# Patient Record
Sex: Female | Born: 1980 | Race: White | Hispanic: No | Marital: Married | Smoking: Former smoker
Health system: Southern US, Community
[De-identification: ages and names within clinical notes are randomized; demographics above are authoritative.]

## PROBLEM LIST (undated history)

## (undated) DIAGNOSIS — I10 Essential (primary) hypertension: Secondary | ICD-10-CM

## (undated) DIAGNOSIS — I456 Pre-excitation syndrome: Secondary | ICD-10-CM

## (undated) HISTORY — DX: Pre-excitation syndrome: I45.6

## (undated) HISTORY — PX: BREAST BIOPSY: SHX20

## (undated) HISTORY — DX: Essential (primary) hypertension: I10

## (undated) HISTORY — PX: HERNIA REPAIR: SHX51

---

## 2008-06-08 ENCOUNTER — Encounter: Admission: RE | Admit: 2008-06-08 | Discharge: 2008-06-08 | Payer: Self-pay | Admitting: Obstetrics and Gynecology

## 2010-02-10 ENCOUNTER — Ambulatory Visit (HOSPITAL_COMMUNITY): Admission: RE | Admit: 2010-02-10 | Discharge: 2010-02-10 | Payer: Self-pay | Admitting: Obstetrics and Gynecology

## 2010-06-29 ENCOUNTER — Encounter
Admission: RE | Admit: 2010-06-29 | Discharge: 2010-06-29 | Payer: Self-pay | Source: Home / Self Care | Attending: Obstetrics and Gynecology | Admitting: Obstetrics and Gynecology

## 2010-09-01 ENCOUNTER — Inpatient Hospital Stay (HOSPITAL_COMMUNITY)
Admission: AD | Admit: 2010-09-01 | Discharge: 2010-09-04 | Payer: Self-pay | Source: Home / Self Care | Attending: Obstetrics and Gynecology | Admitting: Obstetrics and Gynecology

## 2010-09-02 ENCOUNTER — Encounter (INDEPENDENT_AMBULATORY_CARE_PROVIDER_SITE_OTHER): Payer: Self-pay | Admitting: Obstetrics and Gynecology

## 2010-09-09 ENCOUNTER — Encounter
Admission: RE | Admit: 2010-09-09 | Discharge: 2010-10-09 | Payer: Self-pay | Source: Home / Self Care | Attending: Obstetrics and Gynecology | Admitting: Obstetrics and Gynecology

## 2010-10-14 NOTE — Discharge Summary (Signed)
Nichole Gibson, Nichole Gibson               ACCOUNT NO.:  [id number]  MEDICAL RECORD NO.:  [id number]          PATIENT TYPE:  INP  LOCATION:  9120                          FACILITY:  [facility]  PHYSICIAN:  Michelle L. Grewal, M.D.DATE OF BIRTH:  1981-01-30  DATE OF ADMISSION:  09/01/2010 DATE OF DISCHARGE:  09/04/2010                              DISCHARGE SUMMARY   ADMITTING DIAGNOSES: 1. Intrauterine pregnancy at term. 2. Spontaneous rupture of membranes. 3. Spontaneous onset of labor.  DISCHARGE DIAGNOSES:  1. Status post low transverse cesarean section secondary to nonreassuring fetal heart tones. 2. Viable female infant  ADMITTING HISTORY AND PHYSICAL:  Please see dictated H and P.  HOSPITAL COURSE:  This is a 30 year old gravida 2, para 0-0-1-0 that was admitted to [facility] at term with spontaneous onset of contractions and spontaneous rupture of membranes.  Ultrasound was performed which revealed a low AFI and BPP of 8/8, the patient was also known to have a right ovarian cyst which was thought to be probable dermoid, decision had been made that the patient did undergo a cesarean delivery that this cyst would be removed at the time of the delivery. The patient was then transferred to the labor and delivery suite where she was reexamined and found to be 4-5 cm, 90% effaced, vertex at -2 station.  Intrauterine pressure catheter was placed for careful observation of her labor.  Penicillin was also administered for group B beta strep prophylaxis.  The patient did receive epidural for her comfort.  Later physician was advised that baby was experiencing some variable backflow, late decelerations.  Pitocin was discontinued.  The patient was already started on oxygen administration.  Physician did discuss with the patient regarding fetal heart tones which were now stable.  Cervix was reexamined and found to be 7-8 cm, 90% effaced, vertex at a -1 station.  With persistent  fetal heart tones that were nonreassuring, decision was made to proceed with a low transverse cesarean section.  The patient was then taken to the operating room where epidural was dosed to an adequate surgical level.  A low transverse incision was made with delivery of a viable female infant weighing 6 pounds 3 ounces with Apgars of 9 at 1 and 9 at 5 minutes. The patient tolerated the procedure well and taken to the recovery room in stable condition.  On postoperative day #1, the patient was without complaint.  Vital signs were stable.  Abdomen soft, with good return of bowel function.  Abdominal dressing noted be clean, dry and intact.  Laboratory findings were within normal limits.  On postoperative day #2, the patient did desire early discharge.  Vital signs were stable.  Abdomen is soft.  Incision was clean, dry, and intact.  Discharge instructions reviewed and the patient was later discharged home.  CONDITION ON DISCHARGE:  Stable.  Diet regular as tolerated.  Activity no heavy lifting, no driving x2 weeks, no vaginal entry.  FOLLOWUP:  Patient to follow up in the office in 1-2 weeks for an incision check.  She is to call for temperature greater than 100 degrees, persistent nausea, vomiting, heavy  vaginal bleeding, and/or redness or drainage from the  incisional site.  DISCHARGE MEDICATIONS:  Tylox #30 one p.o. q.4-6 hours p.r.n., Motrin 600 mg every 6 hours, prenatal vitamins one p.o. daily, and Colace one p.o. daily p.r.n.     Julio Sicks, N.P.   ______________________________ Stann Mainland. Vincente Poli, M.D.    CC/MEDQ  D:  09/27/2010  T:  09/28/2010  Job:  [id number]  Electronically Signed by Julio Sicks N.P. on 09/28/2010 12:00:35 PM Electronically Signed by Marcelle Overlie M.D. on 10/14/2010 08:43:27 AM

## 2010-12-06 LAB — COMPREHENSIVE METABOLIC PANEL
ALT: 15 U/L (ref 0–35)
AST: 25 U/L (ref 0–37)
AST: 30 U/L (ref 0–37)
Albumin: 2.6 g/dL — ABNORMAL LOW (ref 3.5–5.2)
Alkaline Phosphatase: 125 U/L — ABNORMAL HIGH (ref 39–117)
Alkaline Phosphatase: 188 U/L — ABNORMAL HIGH (ref 39–117)
BUN: 11 mg/dL (ref 6–23)
BUN: 6 mg/dL (ref 6–23)
CO2: 19 mEq/L (ref 19–32)
CO2: 26 mEq/L (ref 19–32)
Chloride: 107 mEq/L (ref 96–112)
Creatinine, Ser: 0.46 mg/dL (ref 0.4–1.2)
Creatinine, Ser: 0.63 mg/dL (ref 0.4–1.2)
GFR calc non Af Amer: >60 mL/min (ref >60)
GFR calc non Af Amer: >60 mL/min (ref >60)
Glucose, Bld: 108 mg/dL — ABNORMAL HIGH (ref 70–99)
Potassium: 3.5 mEq/L (ref 3.5–5.1)
Total Bilirubin: 0.1 mg/dL — ABNORMAL LOW (ref 0.3–1.2)
Total Bilirubin: 0.3 mg/dL (ref 0.3–1.2)
Total Protein: 5.9 g/dL — ABNORMAL LOW (ref 6.0–8.3)

## 2010-12-06 LAB — URINALYSIS, ROUTINE W REFLEX MICROSCOPIC
Bilirubin Urine: NEGATIVE
Glucose, UA: NEGATIVE mg/dL
Ketones, ur: NEGATIVE mg/dL
Nitrite: NEGATIVE
Protein, ur: NEGATIVE mg/dL
Specific Gravity, Urine: <1.005 — ABNORMAL LOW (ref 1.005–1.030)
Urobilinogen, UA: 0.2 mg/dL (ref 0.0–1.0)
pH: 6 (ref 5.0–8.0)

## 2010-12-06 LAB — CBC
HCT: 34.2 % — ABNORMAL LOW (ref 36.0–46.0)
Hemoglobin: 14.1 g/dL (ref 12.0–15.0)
Hemoglobin: 14.1 g/dL (ref 12.0–15.0)
MCH: 31.4 pg (ref 26.0–34.0)
MCHC: 34.3 g/dL (ref 30.0–36.0)
MCHC: 34.5 g/dL (ref 30.0–36.0)
MCV: 88.9 fL (ref 78.0–100.0)
Platelets: 198 10*3/uL (ref 150–400)
RDW: 14 % (ref 11.5–15.5)

## 2010-12-06 LAB — RPR: RPR Ser Ql: NONREACTIVE

## 2010-12-06 LAB — URINE MICROSCOPIC-ADD ON

## 2010-12-06 LAB — LACTATE DEHYDROGENASE: LDH: 163 U/L (ref 94–250)

## 2012-01-11 LAB — OB RESULTS CONSOLE HIV ANTIBODY (ROUTINE TESTING): HIV: NONREACTIVE

## 2012-01-11 LAB — OB RESULTS CONSOLE RPR: RPR: NONREACTIVE

## 2012-01-11 LAB — OB RESULTS CONSOLE RUBELLA ANTIBODY, IGM: Rubella: IMMUNE

## 2012-01-11 LAB — OB RESULTS CONSOLE ABO/RH: RH Type: POSITIVE

## 2012-06-13 ENCOUNTER — Encounter: Payer: Self-pay | Admitting: *Deleted

## 2012-06-13 ENCOUNTER — Encounter: Payer: Managed Care, Other (non HMO) | Attending: Obstetrics and Gynecology | Admitting: *Deleted

## 2012-06-13 DIAGNOSIS — O99810 Abnormal glucose complicating pregnancy: Secondary | ICD-10-CM | POA: Insufficient documentation

## 2012-06-13 DIAGNOSIS — Z713 Dietary counseling and surveillance: Secondary | ICD-10-CM | POA: Insufficient documentation

## 2012-06-13 NOTE — Progress Notes (Signed)
  Patient was seen 1-on-1 on 06/13/2012 for [facility]. The following learning objectives were met by the patient during this visit: Patient attended Class with last pregnancy and expressed good understanding of the following information upon review:   States the definition of Gestational Diabetes  States why dietary management is important in controlling blood glucose  Describes the effects each nutrient has on blood glucose levels  Demonstrates ability to create a balanced meal plan  Demonstrates carbohydrate counting   States when to check blood glucose levels  Demonstrates proper blood glucose monitoring techniques  States the effect of stress and exercise on blood glucose levels  States the importance of limiting caffeine and abstaining from alcohol and smoking  Blood glucose monitor: Information provided on ReiOn Meter available through [facility] as she states her co-pay for strips is too high. Therefore no meter provided at this visit  Patient instructed to monitor glucose levels: FBS: 60 - <90 2 hour: <120  *Patient received handouts:  Nutrition Diabetes and Pregnancy  Carbohydrate Counting List  Patient will be seen for follow-up as needed.

## 2012-06-13 NOTE — Patient Instructions (Signed)
Goals:  Check glucose levels per MD as instructed  Follow Gestational Diabetes Diet as instructed  Call for follow-up as needed    

## 2012-07-29 ENCOUNTER — Encounter (HOSPITAL_COMMUNITY): Payer: Self-pay | Admitting: Pharmacist

## 2012-07-29 NOTE — H&P (Signed)
Nichole Gibson  DICTATION # [id number] CSN# [id number]   Meriel Pica, MD 07/29/2012 10:05 AM

## 2012-07-30 NOTE — H&P (Signed)
Nichole Gibson, Nichole Gibson               ACCOUNT NO.:  [id number]  MEDICAL RECORD NO.:  [id number]  LOCATION:                                 FACILITY:  PHYSICIAN:  Duke Salvia. Marcelle Overlie, M.D.DATE OF BIRTH:  [birth year]  DATE OF ADMISSION:  08/09/2012 DATE OF DISCHARGE:                             HISTORY & PHYSICAL   CHIEF COMPLAINT:  For repeat cesarean section at term.  HISTORY OF PRESENT ILLNESS:  A 31 year old, G3, P1-0-1-1, EDD November 22 presents at term for repeat cesarean section.  This pregnancy was complicated by gestational diabetes with excellent CBG control by diet alone.  The procedure of repeat cesarean section including specific risks related to bleeding, infection, transfusion, adjacent organ injury, wound infection, phlebitis discussed with her which she understands and accepts.  PAST MEDICAL HISTORY:  First C-section in 2011 for arrest of dilatation. Please see her [facility] form for the remainder review of systems and past medical history.  PHYSICAL EXAMINATION:  VITAL SIGNS:  Temp 98.2, blood pressure 130/78. HEENT:  Unremarkable. NECK:  Supple without masses. LUNGS:  Clear. CARDIOVASCULAR:  Regular rate and rhythm without murmurs, rubs, gallops. BREASTS:  Without masses. PELVIC:  Term fundal height.  Fetal heart rate 140.  Cervix was closed. EXTREMITIES AND NEUROLOGIC:  Unremarkable.  IMPRESSION:  Term pregnancy, previous cesarean section, declines p.o. well.  PLAN:  Repeat cesarean section.  Procedure and risks discussed as above.     Khyle Goodell M. Marcelle Overlie, M.D.     RMH/MEDQ  D:  07/29/2012  T:  07/30/2012  Job:  [id number]

## 2012-08-06 ENCOUNTER — Encounter (HOSPITAL_COMMUNITY): Payer: Self-pay

## 2012-08-07 ENCOUNTER — Encounter (HOSPITAL_COMMUNITY)
Admission: RE | Admit: 2012-08-07 | Discharge: 2012-08-07 | Disposition: A | Discharge status: Home / Self Care | Payer: Managed Care, Other (non HMO) | Source: Ambulatory Visit | Attending: Obstetrics and Gynecology | Admitting: Obstetrics and Gynecology

## 2012-08-07 ENCOUNTER — Encounter (HOSPITAL_COMMUNITY): Payer: Self-pay

## 2012-08-07 LAB — BASIC METABOLIC PANEL
GFR calc Af Amer: >90 mL/min (ref >90)
GFR calc non Af Amer: >90 mL/min (ref >90)
Potassium: 3.8 mEq/L (ref 3.5–5.1)
Sodium: 135 mEq/L (ref 135–145)

## 2012-08-07 LAB — CBC
Hemoglobin: 13.7 g/dL (ref 12.0–15.0)
MCH: 30.4 pg (ref 26.0–34.0)
MCHC: 34.6 g/dL (ref 30.0–36.0)

## 2012-08-07 NOTE — Patient Instructions (Signed)
Your procedure is scheduled on:08/09/12  Enter through the Main Entrance at :6am Pick up desk phone and dial [phone] and inform us of your arrival.  Please call [phone] if you have any problems the morning of surgery.  Remember: Do not eat or drink after midnight:Thursday   Take these meds the morning of surgery with a sip of water:none  DO NOT wear jewelry, eye make-up, lipstick,body lotion, or dark fingernail polish. Do not shave for 48 hours prior to surgery.  If you are to be admitted after surgery, leave suitcase in car until your room has been assigned.

## 2012-08-08 LAB — TYPE AND SCREEN: Unit division: 0

## 2012-08-08 LAB — RPR: RPR Ser Ql: NONREACTIVE

## 2012-08-09 ENCOUNTER — Inpatient Hospital Stay (HOSPITAL_COMMUNITY)
Admission: RE | Admit: 2012-08-09 | Discharge: 2012-08-11 | DRG: 766 | Disposition: A | Discharge status: Home / Self Care | Payer: Managed Care, Other (non HMO) | Source: Ambulatory Visit | Attending: Obstetrics and Gynecology | Admitting: Obstetrics and Gynecology

## 2012-08-09 ENCOUNTER — Encounter (HOSPITAL_COMMUNITY): Payer: Self-pay

## 2012-08-09 ENCOUNTER — Encounter (HOSPITAL_COMMUNITY)
Admission: RE | Disposition: A | Discharge status: Home / Self Care | Payer: Self-pay | Source: Ambulatory Visit | Attending: Obstetrics and Gynecology

## 2012-08-09 ENCOUNTER — Inpatient Hospital Stay (HOSPITAL_COMMUNITY): Payer: Managed Care, Other (non HMO)

## 2012-08-09 ENCOUNTER — Encounter (HOSPITAL_COMMUNITY): Payer: Self-pay | Admitting: *Deleted

## 2012-08-09 DIAGNOSIS — O34219 Maternal care for unspecified type scar from previous cesarean delivery: Principal | ICD-10-CM | POA: Diagnosis present

## 2012-08-09 DIAGNOSIS — O99814 Abnormal glucose complicating childbirth: Secondary | ICD-10-CM | POA: Diagnosis present

## 2012-08-09 DIAGNOSIS — Z01812 Encounter for preprocedural laboratory examination: Secondary | ICD-10-CM

## 2012-08-09 DIAGNOSIS — Z01818 Encounter for other preprocedural examination: Secondary | ICD-10-CM

## 2012-08-09 LAB — GLUCOSE, CAPILLARY
Glucose-Capillary: 84 mg/dL (ref 70–99)
Glucose-Capillary: 88 mg/dL (ref 70–99)

## 2012-08-09 SURGERY — Surgical Case
Anesthesia: Spinal

## 2012-08-09 MED ORDER — SCOPOLAMINE 1 MG/3DAYS TD PT72
MEDICATED_PATCH | TRANSDERMAL | Status: AC
  Administered 2012-08-09: 1.5 mg via TRANSDERMAL
  Filled 2012-08-09: qty 1

## 2012-08-09 MED ORDER — WITCH HAZEL-GLYCERIN EX PADS: 1.0000 "application " | MEDICATED_PAD | CUTANEOUS | Status: DC | PRN

## 2012-08-09 MED ORDER — METOCLOPRAMIDE HCL 5 MG/ML IJ SOLN: 10.0000 mg | Freq: Three times a day (TID) | INTRAMUSCULAR | Status: DC | PRN

## 2012-08-09 MED ORDER — NALBUPHINE HCL 10 MG/ML IJ SOLN
5.0000 mg | INTRAMUSCULAR | Status: DC | PRN
  Filled 2012-08-09: qty 1

## 2012-08-09 MED ORDER — PHENYLEPHRINE 40 MCG/ML (10ML) SYRINGE FOR IV PUSH (FOR BLOOD PRESSURE SUPPORT)
PREFILLED_SYRINGE | INTRAVENOUS | Status: AC
  Filled 2012-08-09: qty 5

## 2012-08-09 MED ORDER — EPHEDRINE SULFATE 50 MG/ML IJ SOLN
INTRAMUSCULAR | Status: DC | PRN
  Administered 2012-08-09: 5 mg via INTRAVENOUS
  Administered 2012-08-09 (×2): 10 mg via INTRAVENOUS

## 2012-08-09 MED ORDER — DIPHENHYDRAMINE HCL 25 MG PO CAPS: 25.0000 mg | ORAL_CAPSULE | Freq: Four times a day (QID) | ORAL | Status: DC | PRN

## 2012-08-09 MED ORDER — DIPHENHYDRAMINE HCL 25 MG PO CAPS: 25.0000 mg | ORAL_CAPSULE | ORAL | Status: DC | PRN

## 2012-08-09 MED ORDER — SIMETHICONE 80 MG PO CHEW
80.0000 mg | CHEWABLE_TABLET | Freq: Three times a day (TID) | ORAL | Status: DC
  Administered 2012-08-09 – 2012-08-11 (×7): 80 mg via ORAL

## 2012-08-09 MED ORDER — SODIUM CHLORIDE 0.9 % IJ SOLN: 3.0000 mL | INTRAMUSCULAR | Status: DC | PRN

## 2012-08-09 MED ORDER — MEASLES, MUMPS & RUBELLA VAC ~~LOC~~ INJ
0.5000 mL | INJECTION | Freq: Once | SUBCUTANEOUS | Status: DC
  Filled 2012-08-09: qty 0.5

## 2012-08-09 MED ORDER — SCOPOLAMINE 1 MG/3DAYS TD PT72
1.0000 | MEDICATED_PATCH | Freq: Once | TRANSDERMAL | Status: DC
  Administered 2012-08-09: 1.5 mg via TRANSDERMAL

## 2012-08-09 MED ORDER — MORPHINE SULFATE (PF) 0.5 MG/ML IJ SOLN
INTRAMUSCULAR | Status: DC | PRN
  Administered 2012-08-09: .1 mg via INTRATHECAL

## 2012-08-09 MED ORDER — LACTATED RINGERS IV SOLN
INTRAVENOUS | Status: DC
  Administered 2012-08-09: 125 mL/h via INTRAVENOUS
  Administered 2012-08-09: 08:00:00 via INTRAVENOUS
  Administered 2012-08-09: 125 mL/h via INTRAVENOUS

## 2012-08-09 MED ORDER — SENNOSIDES-DOCUSATE SODIUM 8.6-50 MG PO TABS
2.0000 | ORAL_TABLET | Freq: Every day | ORAL | Status: DC
  Administered 2012-08-09 – 2012-08-10 (×2): 2 via ORAL

## 2012-08-09 MED ORDER — BISACODYL 10 MG RE SUPP: 10.0000 mg | Freq: Every day | RECTAL | Status: DC | PRN

## 2012-08-09 MED ORDER — IBUPROFEN 800 MG PO TABS
800.0000 mg | ORAL_TABLET | Freq: Three times a day (TID) | ORAL | Status: DC | PRN
  Administered 2012-08-10: 800 mg via ORAL
  Filled 2012-08-09: qty 1

## 2012-08-09 MED ORDER — CEFAZOLIN SODIUM-DEXTROSE 2-3 GM-% IV SOLR
INTRAVENOUS | Status: AC
  Filled 2012-08-09: qty 50

## 2012-08-09 MED ORDER — NALOXONE HCL 0.4 MG/ML IJ SOLN: 0.4000 mg | INTRAMUSCULAR | Status: DC | PRN

## 2012-08-09 MED ORDER — OXYTOCIN 10 UNIT/ML IJ SOLN
40.0000 [IU] | INTRAVENOUS | Status: DC | PRN
  Administered 2012-08-09: 40 [IU] via INTRAVENOUS

## 2012-08-09 MED ORDER — KETOROLAC TROMETHAMINE 60 MG/2ML IM SOLN
60.0000 mg | Freq: Once | INTRAMUSCULAR | Status: AC | PRN
  Administered 2012-08-09: 60 mg via INTRAMUSCULAR

## 2012-08-09 MED ORDER — KETOROLAC TROMETHAMINE 60 MG/2ML IM SOLN
INTRAMUSCULAR | Status: AC
  Administered 2012-08-09: 60 mg via INTRAMUSCULAR
  Filled 2012-08-09: qty 2

## 2012-08-09 MED ORDER — FENTANYL CITRATE 0.05 MG/ML IJ SOLN
INTRAMUSCULAR | Status: DC | PRN
  Administered 2012-08-09: 25 ug via INTRATHECAL

## 2012-08-09 MED ORDER — SIMETHICONE 80 MG PO CHEW: 80.0000 mg | CHEWABLE_TABLET | ORAL | Status: DC | PRN

## 2012-08-09 MED ORDER — DIPHENHYDRAMINE HCL 50 MG/ML IJ SOLN: 25.0000 mg | INTRAMUSCULAR | Status: DC | PRN

## 2012-08-09 MED ORDER — KETOROLAC TROMETHAMINE 30 MG/ML IJ SOLN: 30.0000 mg | Freq: Four times a day (QID) | INTRAMUSCULAR | Status: AC | PRN

## 2012-08-09 MED ORDER — SCOPOLAMINE 1 MG/3DAYS TD PT72
1.0000 | MEDICATED_PATCH | Freq: Once | TRANSDERMAL | Status: DC
  Filled 2012-08-09: qty 1

## 2012-08-09 MED ORDER — ONDANSETRON HCL 4 MG/2ML IJ SOLN: 4.0000 mg | Freq: Three times a day (TID) | INTRAMUSCULAR | Status: DC | PRN

## 2012-08-09 MED ORDER — ONDANSETRON HCL 4 MG/2ML IJ SOLN
INTRAMUSCULAR | Status: AC
  Filled 2012-08-09: qty 2

## 2012-08-09 MED ORDER — FENTANYL CITRATE 0.05 MG/ML IJ SOLN
INTRAMUSCULAR | Status: AC
  Filled 2012-08-09: qty 2

## 2012-08-09 MED ORDER — DIPHENHYDRAMINE HCL 50 MG/ML IJ SOLN: 12.5000 mg | INTRAMUSCULAR | Status: DC | PRN

## 2012-08-09 MED ORDER — ONDANSETRON HCL 4 MG PO TABS: 4.0000 mg | ORAL_TABLET | ORAL | Status: DC | PRN

## 2012-08-09 MED ORDER — DIBUCAINE 1 % RE OINT: 1.0000 "application " | TOPICAL_OINTMENT | RECTAL | Status: DC | PRN

## 2012-08-09 MED ORDER — PHENYLEPHRINE HCL 10 MG/ML IJ SOLN
INTRAMUSCULAR | Status: DC | PRN
  Administered 2012-08-09 (×2): 40 ug via INTRAVENOUS

## 2012-08-09 MED ORDER — LANOLIN HYDROUS EX OINT: 1.0000 "application " | TOPICAL_OINTMENT | CUTANEOUS | Status: DC | PRN

## 2012-08-09 MED ORDER — CEFAZOLIN SODIUM-DEXTROSE 2-3 GM-% IV SOLR
2.0000 g | INTRAVENOUS | Status: AC
  Administered 2012-08-09: 2 g via INTRAVENOUS

## 2012-08-09 MED ORDER — HYDROMORPHONE HCL PF 1 MG/ML IJ SOLN: 0.2500 mg | INTRAMUSCULAR | Status: DC | PRN

## 2012-08-09 MED ORDER — SODIUM CHLORIDE 0.9 % IV SOLN: 250.0000 mL | INTRAVENOUS | Status: DC

## 2012-08-09 MED ORDER — ZOLPIDEM TARTRATE 5 MG PO TABS: 5.0000 mg | ORAL_TABLET | Freq: Every evening | ORAL | Status: DC | PRN

## 2012-08-09 MED ORDER — MEPERIDINE HCL 25 MG/ML IJ SOLN: 6.2500 mg | INTRAMUSCULAR | Status: DC | PRN

## 2012-08-09 MED ORDER — BUPIVACAINE IN DEXTROSE 0.75-8.25 % IT SOLN
INTRATHECAL | Status: DC | PRN
  Administered 2012-08-09: 1.4 mL via INTRATHECAL

## 2012-08-09 MED ORDER — SODIUM CHLORIDE 0.9 % IJ SOLN: 3.0000 mL | Freq: Two times a day (BID) | INTRAMUSCULAR | Status: DC

## 2012-08-09 MED ORDER — FLEET ENEMA 7-19 GM/118ML RE ENEM: 1.0000 | ENEMA | Freq: Every day | RECTAL | Status: DC | PRN

## 2012-08-09 MED ORDER — OXYTOCIN 40 UNITS IN LACTATED RINGERS INFUSION - SIMPLE MED: 62.5000 mL/h | INTRAVENOUS | Status: AC

## 2012-08-09 MED ORDER — ONDANSETRON HCL 4 MG/2ML IJ SOLN
INTRAMUSCULAR | Status: DC | PRN
  Administered 2012-08-09: 4 mg via INTRAVENOUS

## 2012-08-09 MED ORDER — ONDANSETRON HCL 4 MG/2ML IJ SOLN: 4.0000 mg | INTRAMUSCULAR | Status: DC | PRN

## 2012-08-09 MED ORDER — EPHEDRINE 5 MG/ML INJ
INTRAVENOUS | Status: AC
  Filled 2012-08-09: qty 10

## 2012-08-09 MED ORDER — MORPHINE SULFATE 0.5 MG/ML IJ SOLN
INTRAMUSCULAR | Status: AC
  Filled 2012-08-09: qty 10

## 2012-08-09 MED ORDER — MENTHOL 3 MG MT LOZG: 1.0000 | LOZENGE | OROMUCOSAL | Status: DC | PRN

## 2012-08-09 MED ORDER — PRENATAL MULTIVITAMIN CH
1.0000 | ORAL_TABLET | Freq: Every day | ORAL | Status: DC
  Administered 2012-08-09 – 2012-08-11 (×3): 1 via ORAL
  Filled 2012-08-09 (×3): qty 1

## 2012-08-09 MED ORDER — OXYCODONE-ACETAMINOPHEN 5-325 MG PO TABS
1.0000 | ORAL_TABLET | Freq: Four times a day (QID) | ORAL | Status: DC | PRN
  Administered 2012-08-10 – 2012-08-11 (×3): 1 via ORAL
  Filled 2012-08-09 (×4): qty 1

## 2012-08-09 MED ORDER — TETANUS-DIPHTH-ACELL PERTUSSIS 5-2.5-18.5 LF-MCG/0.5 IM SUSP: 0.5000 mL | Freq: Once | INTRAMUSCULAR | Status: DC

## 2012-08-09 MED ORDER — OXYTOCIN 10 UNIT/ML IJ SOLN
INTRAMUSCULAR | Status: AC
  Filled 2012-08-09: qty 4

## 2012-08-09 MED ORDER — NALOXONE HCL 1 MG/ML IJ SOLN
1.0000 ug/kg/h | INTRAVENOUS | Status: DC | PRN
  Filled 2012-08-09: qty 2

## 2012-08-09 MED ORDER — KETOROLAC TROMETHAMINE 30 MG/ML IJ SOLN
30.0000 mg | Freq: Four times a day (QID) | INTRAMUSCULAR | Status: AC | PRN
  Administered 2012-08-09: 30 mg via INTRAVENOUS
  Filled 2012-08-09: qty 1

## 2012-08-09 MED ORDER — IBUPROFEN 600 MG PO TABS
600.0000 mg | ORAL_TABLET | Freq: Four times a day (QID) | ORAL | Status: DC | PRN
  Administered 2012-08-10 – 2012-08-11 (×4): 600 mg via ORAL
  Filled 2012-08-09 (×4): qty 1

## 2012-08-09 SURGICAL SUPPLY — 27 items
CLOTH BEACON ORANGE TIMEOUT ST (SAFETY) ×2 IMPLANT
DRAPE SURG 17X23 STRL (DRAPES) ×2 IMPLANT
DRESSING TELFA 8X3 (GAUZE/BANDAGES/DRESSINGS) IMPLANT
DRSG COVADERM 4X10 (GAUZE/BANDAGES/DRESSINGS) ×2 IMPLANT
DURAPREP 26ML APPLICATOR (WOUND CARE) ×2 IMPLANT
ELECT REM PT RETURN 9FT ADLT (ELECTROSURGICAL) ×2
ELECTRODE REM PT RTRN 9FT ADLT (ELECTROSURGICAL) ×1 IMPLANT
EXTRACTOR VACUUM M CUP 4 TUBE (SUCTIONS) IMPLANT
GAUZE SPONGE 4X4 12PLY STRL LF (GAUZE/BANDAGES/DRESSINGS) ×4 IMPLANT
GLOVE BIO SURGEON STRL SZ7 (GLOVE) ×4 IMPLANT
GOWN PREVENTION PLUS LG XLONG (DISPOSABLE) ×6 IMPLANT
KIT ABG SYR 3ML LUER SLIP (SYRINGE) IMPLANT
NEEDLE HYPO 25X5/8 SAFETYGLIDE (NEEDLE) ×2 IMPLANT
NS IRRIG 1000ML POUR BTL (IV SOLUTION) ×2 IMPLANT
PACK C SECTION WH (CUSTOM PROCEDURE TRAY) ×2 IMPLANT
PAD ABD 7.5X8 STRL (GAUZE/BANDAGES/DRESSINGS) IMPLANT
PAD OB MATERNITY 4.3X12.25 (PERSONAL CARE ITEMS) IMPLANT
SLEEVE SCD COMPRESS KNEE MED (MISCELLANEOUS) IMPLANT
STRIP CLOSURE SKIN 1/2X4 (GAUZE/BANDAGES/DRESSINGS) ×2 IMPLANT
SUT CHROMIC 0 CTX 36 (SUTURE) ×6 IMPLANT
SUT MON AB 4-0 PS1 27 (SUTURE) ×2 IMPLANT
SUT PDS AB 0 CT1 27 (SUTURE) ×4 IMPLANT
SUT VIC AB 3-0 CT1 27 (SUTURE) ×2
SUT VIC AB 3-0 CT1 TAPERPNT 27 (SUTURE) ×2 IMPLANT
TOWEL OR 17X24 6PK STRL BLUE (TOWEL DISPOSABLE) ×4 IMPLANT
TRAY FOLEY CATH 14FR (SET/KITS/TRAYS/PACK) ×2 IMPLANT
WATER STERILE IRR 1000ML POUR (IV SOLUTION) ×2 IMPLANT

## 2012-08-09 NOTE — Progress Notes (Signed)
The patient was re-examined with no change in status 

## 2012-08-09 NOTE — Transfer of Care (Signed)
Immediate Anesthesia Transfer of Care Note  Patient: Nichole Gibson  Procedure(s) Performed: Procedure(s) (LRB) with comments: CESAREAN SECTION (N/A) - REPEAT [facility] 08/16/12  Patient Location: PACU  Anesthesia Type:Spinal  Level of Consciousness: awake, alert  and oriented  Airway & Oxygen Therapy: Patient Spontanous Breathing  Post-op Assessment: Report given to PACU RN and Post -op Vital signs reviewed and stable  Post vital signs: Reviewed and stable  Complications: No apparent anesthesia complications

## 2012-08-09 NOTE — Anesthesia Preprocedure Evaluation (Signed)
Anesthesia Evaluation  Patient identified by MRN, date of birth, ID band Patient awake    Reviewed: Allergy & Precautions, H&P , Patient's Chart, lab work & pertinent test results  Airway Mallampati: II TM Distance: >3 FB Neck ROM: full    Dental No notable dental hx.    Pulmonary  breath sounds clear to auscultation  Pulmonary exam normal       Cardiovascular Exercise Tolerance: Good Rhythm:regular Rate:Normal     Neuro/Psych    GI/Hepatic   Endo/Other  diabetes, Gestational  Renal/GU      Musculoskeletal   Abdominal   Peds  Hematology   Anesthesia Other Findings   Reproductive/Obstetrics                           Anesthesia Physical Anesthesia Plan  ASA: III  Anesthesia Plan: Spinal   Post-op Pain Management:    Induction:   Airway Management Planned:   Additional Equipment:   Intra-op Plan:   Post-operative Plan:   Informed Consent: I have reviewed the patients History and Physical, chart, labs and discussed the procedure including the risks, benefits and alternatives for the proposed anesthesia with the patient or authorized representative who has indicated his/her understanding and acceptance.   Dental Advisory Given  Plan Discussed with: CRNA  Anesthesia Plan Comments: (Lab work confirmed with CRNA in room. Platelets okay. Discussed spinal anesthetic, and patient consents to the procedure:  included risk of possible headache,backache, failed block, allergic reaction, and nerve injury. This patient was asked if she had any questions or concerns before the procedure started. )        Anesthesia Quick Evaluation

## 2012-08-09 NOTE — Addendum Note (Signed)
Addendum  created 08/09/12 1532 by Gertie Fey, CRNA   Modules edited:Notes Section

## 2012-08-09 NOTE — Anesthesia Postprocedure Evaluation (Signed)
Anesthesia Post Note  Patient: Nichole Gibson  Procedure(s) Performed: Procedure(s) (LRB): CESAREAN SECTION (N/A)  Anesthesia type: Spinal  Patient location: PACU  Post pain: Pain level controlled  Post assessment: Post-op Vital signs reviewed  Last Vitals:  Filed Vitals:   08/09/12 0900  BP: 126/76  Pulse: 86  Temp:   Resp: 20    Post vital signs: Reviewed  Level of consciousness: awake  Complications: No apparent anesthesia complications

## 2012-08-09 NOTE — Anesthesia Postprocedure Evaluation (Signed)
  Anesthesia Post-op Note  Patient: Nichole Gibson  Procedure(s) Performed: Procedure(s) (LRB) with comments: CESAREAN SECTION (N/A) - REPEAT [facility] 08/16/12  Patient Location: PACU and Mother/Baby  Anesthesia Type:Spinal  Level of Consciousness: awake, alert  and oriented  Airway and Oxygen Therapy: Patient Spontanous Breathing  Post-op Pain: none  Post-op Assessment: Post-op Vital signs reviewed  Post-op Vital Signs: Reviewed and stable  Complications: No apparent anesthesia complications

## 2012-08-09 NOTE — Op Note (Signed)
Preoperative diagnosis: Term pregnancy, repeat cesarean section, declines TOL  Postoperative diagnosis: Same  Surgeon: Marcelle Overlie  Procedure: Repeat low transverse cesarean section  Anesthesia: Spinal  EBL: 700 cc  Complications: None  Procedure and findings:  Patient taken the operating room after an adequate level of spinal anesthetic was obtained with the patient in left tilt position the abdomen prepped and draped per protocol, Foley catheter positioned. Appropriate timeout for taken at that point. Transverse incision was made excising the old scar this is carried down to the fascia which was incised and extended transversely. Rectus muscle divided in the midline peritoneum entered superiorly without incident and extended in a vertical fashion. The vesicouterine serosa was incised and the bladder was sharply and bluntly dissected below minimally. Bladder blade was repositioned. Transverse incision made lower segment extended with blunt dissection clear fluid noted the patient delivered of a healthy female Apgars 9 and 9 the infant was suctioned cord clamped and passed the pediatric team for further care. The placenta was then delivered spontaneously, uterus exteriorized cavity wiped clean with a laparotomy pack closure obtained the first layer of 0 chromic in a locked fashion followed by an imbricating layer of 0 chromic. Tubes and ovaries were normal. The bladder flap peritoneum closed and Interceed was positioned in a T. fashion. Prior to closure sponge denies precast reported as correct x2 peritoneum closed the running 3-0 Vicryl suture. Fascia then closed laterally to midline on either side with 0 PDS suture very minimal subcutaneous fat, this was undermined to allow for reduce tension the skin closure this was irrigated and made hemostatic with the Bovie 4-0 Monocryl subcuticular closure with a pressure dressing applied she tolerated this well went to recovery in good condition. Clear urine noted  in the case  Dictated with [facility]  Wanda Rideout M. Milana Obey.D.

## 2012-08-09 NOTE — Anesthesia Procedure Notes (Signed)
Spinal  Patient location during procedure: OR Start time: 08/09/2012 7:39 AM Staffing Anesthesiologist: Brayton Caves R Performed by: anesthesiologist  Preanesthetic Checklist Completed: patient identified, site marked, surgical consent, pre-op evaluation, timeout performed, IV checked, risks and benefits discussed and monitors and equipment checked Spinal Block Patient position: sitting Prep: DuraPrep Patient monitoring: heart rate, cardiac monitor, continuous pulse ox and blood pressure Approach: midline Location: L3-4 Injection technique: single-shot Needle Needle type: Sprotte  Needle gauge: 24 G Needle length: 9 cm Assessment Sensory level: T4 Additional Notes Patient identified.  Risk benefits discussed including failed block, incomplete pain control, headache, nerve damage, paralysis, blood pressure changes, nausea, vomiting, reactions to medication both toxic or allergic, and postpartum back pain.  Patient expressed understanding and wished to proceed.  All questions were answered.  Sterile technique used throughout procedure.  CSF was clear.  No parasthesia or other complications.  Please see nursing notes for vital signs.

## 2012-08-10 LAB — CBC
HCT: 30.3 % — ABNORMAL LOW (ref 36.0–46.0)
Hemoglobin: 10.4 g/dL — ABNORMAL LOW (ref 12.0–15.0)
MCHC: 34.3 g/dL (ref 30.0–36.0)

## 2012-08-10 MED ORDER — CHLORHEXIDINE GLUCONATE CLOTH 2 % EX PADS
6.0000 | MEDICATED_PAD | Freq: Every day | CUTANEOUS | Status: DC
  Administered 2012-08-10 – 2012-08-11 (×2): 6 via TOPICAL

## 2012-08-10 MED ORDER — MUPIROCIN 2 % EX OINT
1.0000 "application " | TOPICAL_OINTMENT | Freq: Two times a day (BID) | CUTANEOUS | Status: DC
  Administered 2012-08-10 – 2012-08-11 (×2): 1 via NASAL

## 2012-08-10 NOTE — Progress Notes (Signed)
Subjective: Postpartum Day 1: Cesarean Delivery Patient reports tolerating PO and no problems voiding.  Pt desires baby circ.  R/b/a discussed and informed consent  Objective: Vital signs in last 24 hours: Temp:  [97.3 F (36.3 C)-98.9 F (37.2 C)] 98.8 F (37.1 C) (11/16 0800) Pulse Rate:  [71-95] 91  (11/16 0800) Resp:  [16-20] 16  (11/16 0800) BP: (100-133)/(59-81) 120/66 mmHg (11/16 0800) SpO2:  [95 %-100 %] 97 % (11/16 0800)  Physical Exam:  General: alert and cooperative Lochia: appropriate Uterine Fundus: firm Incision: healing well, no significant drainage DVT Evaluation: No evidence of DVT seen on physical exam.   Basename 08/10/12 0600 08/07/12 1520  HGB 10.4* 13.7  HCT 30.3* 39.6    Assessment/Plan: Status post Cesarean section. Doing well postoperatively.  Continue current care.  Shameria Trimarco 08/10/2012, 9:18 AM

## 2012-08-11 MED ORDER — OXYCODONE-ACETAMINOPHEN 5-325 MG PO TABS: 1.0000 | ORAL_TABLET | Freq: Four times a day (QID) | ORAL | Status: DC | PRN

## 2012-08-11 MED ORDER — IBUPROFEN 600 MG PO TABS: 600.0000 mg | ORAL_TABLET | Freq: Four times a day (QID) | ORAL | Status: DC | PRN

## 2012-08-11 NOTE — Discharge Summary (Signed)
Obstetric Discharge Summary Reason for Admission: cesarean section Prenatal Procedures: ultrasound Intrapartum Procedures: spontaneous vaginal delivery and cesarean: low cervical, transverse Postpartum Procedures: none Complications-Operative and Postpartum: none Hemoglobin  Date Value Range Status  08/10/2012 10.4* 12.0 - 15.0 g/dL Final     HCT  Date Value Range Status  08/10/2012 30.3* 36.0 - 46.0 % Final    Physical Exam:  General: alert and cooperative Lochia: appropriate Uterine Fundus: firm Incision: healing well, no significant drainage DVT Evaluation: No evidence of DVT seen on physical exam.  Discharge Diagnoses: Term Pregnancy-delivered  Discharge Information: Date: 08/11/2012 Activity: pelvic rest Diet: routine Medications: PNV, Ibuprofen and Percocet Condition: stable Instructions: refer to practice specific booklet Discharge to: home Follow-up Information    Schedule an appointment as soon as possible for a visit in 2 weeks to follow up.         Newborn Data: Live born female  Birth Weight: 7 lb 5.8 oz (3340 g) APGAR: ,   Home with mother.  Nichole Gibson 08/11/2012, 8:02 AM

## 2012-08-12 ENCOUNTER — Encounter (HOSPITAL_COMMUNITY): Payer: Self-pay | Admitting: Obstetrics and Gynecology

## 2012-08-15 ENCOUNTER — Telehealth (HOSPITAL_COMMUNITY): Payer: Self-pay | Admitting: *Deleted

## 2012-08-15 NOTE — Telephone Encounter (Signed)
Resolve episode 

## 2014-07-27 ENCOUNTER — Encounter (HOSPITAL_COMMUNITY): Payer: Self-pay | Admitting: Obstetrics and Gynecology

## 2014-08-04 ENCOUNTER — Other Ambulatory Visit: Payer: Self-pay | Admitting: Obstetrics and Gynecology

## 2014-08-05 LAB — CYTOLOGY - PAP

## 2014-08-12 ENCOUNTER — Telehealth: Payer: Self-pay | Admitting: Cardiology

## 2014-08-12 NOTE — Telephone Encounter (Signed)
Received records from [facility] (Dr Molli Posey) for appointment with Dr Martinique on 10/05/14.  Records given to [facility] (medical records) for Dr Doug Sou schedule on 10/05/14.  lp

## 2014-10-05 ENCOUNTER — Ambulatory Visit (INDEPENDENT_AMBULATORY_CARE_PROVIDER_SITE_OTHER): Payer: Managed Care, Other (non HMO) | Admitting: Cardiology

## 2014-10-05 ENCOUNTER — Encounter: Payer: Self-pay | Admitting: Cardiology

## 2014-10-05 VITALS — BP 144/94 | HR 70 | Ht 59.5 in | Wt 129.3 lb

## 2014-10-05 DIAGNOSIS — I456 Pre-excitation syndrome: Secondary | ICD-10-CM | POA: Insufficient documentation

## 2014-10-05 DIAGNOSIS — I10 Essential (primary) hypertension: Secondary | ICD-10-CM

## 2014-10-05 MED ORDER — VALSARTAN 160 MG PO TABS: 160.0000 mg | ORAL_TABLET | Freq: Every day | ORAL | Status: DC

## 2014-10-05 NOTE — Patient Instructions (Signed)
We will get a copy of your lab work from Dr. Shelia Media  Continue your current medication  We will start you on Valsartan 160 mg daily  Keep your BP log  We will schedule you for Renal doppler and Urine analysis for metanephrines.  I will see you in 2 months.

## 2014-10-05 NOTE — Progress Notes (Signed)
Nichole Gibson Date of Birth: [birth year] Medical Record #[id number]  History of Present Illness: Nichole Gibson is seen at the request of Dr. 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 She is a pleasant 34 yo WF. 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 She has never had palpitations or tachycardia. No history of syncope or dizziness. No history of murmur.  Recently she was diagnosed with severe HTN. BP was as high as 180/110. She was started on labetalol. Her am pressures have improved but she still routinely has BP readings of 170-180 in the pm. She denies any symptoms. No chest pain or dyspnea. No flushing or sweats. She does have a family history of HTN with her mother. She had recent complete lab work with Dr. Shelia Media. She reports eating a healthy diet and exercises regularly.    Medication List       This list is accurate as of: 10/05/14  9:29 PM.  Always use your most recent med list.               labetalol 200 MG tablet  Commonly known as:  NORMODYNE  Take 200 mg by mouth 2 (two) times daily.     valsartan 160 MG tablet  Commonly known as:  DIOVAN  Take 1 tablet (160 mg total) by mouth daily.        No Known Allergies  Past Medical History  Diagnosis Date  . Diabetes mellitus     gestational with preg  . WPW syndrome   . HTN (hypertension)     Past Surgical History  Procedure Laterality Date  . Cesarean section    . Hernia repair    . Cesarean section  08/09/2012    Procedure: CESAREAN SECTION;  Surgeon: Margarette Asal, MD;  Location: [facility];  Service: Obstetrics;  Laterality: N/A;  REPEAT [facility] 08/16/12    History   Social History  . Marital Status: Married    Spouse Name: N/A    Number of Children: 2  . Years of Education: N/A   Occupational History  . speech therapist    Social History Main Topics  . Smoking status: Former Smoker -- 0.15 packs/day for 4 years    Types: Cigarettes    Quit date: 06/13/2004  . Smokeless tobacco:  Never Used  . Alcohol Use: No  . Drug Use: No  . Sexual Activity: Yes    Birth Control/ Protection: None     Comment: pregnant   Other Topics Concern  . None   Social History Narrative    Family History  Problem Relation Age of Onset  . Hyperlipidemia Other   . Hypertension Other     Review of Systems: As noted in HPI.  All other systems were reviewed and are negative.  Physical Exam: BP 144/94 mmHg  Pulse 70  Ht 4' 11.5" (1.511 m)  Wt 129 lb 4.8 oz (58.65 kg)  BMI 25.69 kg/m2 Filed Weights   10/05/14 1600  Weight: 129 lb 4.8 oz (58.65 kg)  GENERAL:  Well appearing WF in NAD HEENT:  PERRL, EOMI, sclera are clear. Oropharynx is clear. NECK:  No jugular venous distention, carotid upstroke brisk and symmetric, no bruits, no thyromegaly or adenopathy LUNGS:  Clear to auscultation bilaterally CHEST:  Unremarkable HEART:  RRR,  PMI not displaced or sustained,S1 and S2 within normal limits, no S3, no S4: no clicks, no rubs, no murmurs ABD:  Soft, nontender. BS +, no masses  or bruits. No hepatomegaly, no splenomegaly EXT:  2 + pulses throughout, no edema, no cyanosis no clubbing SKIN:  Warm and dry.  No rashes NEURO:  Alert and oriented x 3. Cranial nerves II through XII intact. PSYCH:  Cognitively intact    LABORATORY DATA: Ecg dated 09/24/14 shows NSR with WPW syndrome with short PR and delta wave.   Assessment / Plan: 1. Severe HTN. BP improved on labetalol but still significantly high. Will add Valsartan 160 mg daily. I have requested lab work from Dr. Shelia Media. I have recommended work up for secondary causes of HTN. Will check renal duplex and collect 24 hour urine for metanephrines. Low sodium, heart healthy diet. Continue exercise. I will follow up in 2 months.  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 Patient is asymptomatic. At this point I have not recommended any further work up. If she should develop symptoms of palpitations in the future we would need to consider further work up.  A stress test could be useful to see if pre-excitation resolves with higher heart rates which would indicate a slower pathway.

## 2014-10-06 ENCOUNTER — Telehealth: Payer: Self-pay | Admitting: Cardiology

## 2014-10-06 NOTE — Telephone Encounter (Signed)
Pt wants to know if she need to stop her medicine for a 24 hour urine specimen?

## 2014-10-06 NOTE — Telephone Encounter (Signed)
LM that there is no indication in Dr. Doug Sou note that patient should hold medications for 24 hour urine.

## 2014-10-12 ENCOUNTER — Ambulatory Visit (HOSPITAL_COMMUNITY)
Admission: RE | Admit: 2014-10-12 | Discharge: 2014-10-12 | Disposition: A | Discharge status: Home / Self Care | Payer: Managed Care, Other (non HMO) | Source: Ambulatory Visit | Attending: Cardiology | Admitting: Cardiology

## 2014-10-12 DIAGNOSIS — I10 Essential (primary) hypertension: Secondary | ICD-10-CM | POA: Diagnosis not present

## 2014-10-12 DIAGNOSIS — I456 Pre-excitation syndrome: Secondary | ICD-10-CM | POA: Insufficient documentation

## 2014-10-12 NOTE — Progress Notes (Signed)
Renal Duplex Completed. No evidence of renal artery stenosis noted. Oda Cogan, BS, RDMS, RVT

## 2014-10-16 LAB — METANEPHRINES, URINE, 24 HOUR
METANEPH TOTAL UR: 475 ug/(24.h) (ref 115–695)
METANEPHRINES UR: 126 ug/(24.h) (ref 36–190)
NORMETANEPHRINE 24H UR: 349 ug/(24.h) (ref 35–482)

## 2014-11-04 ENCOUNTER — Encounter: Payer: Self-pay | Admitting: Cardiology

## 2014-11-11 ENCOUNTER — Telehealth: Payer: Self-pay | Admitting: Cardiology

## 2014-11-11 NOTE — Telephone Encounter (Signed)
Nichole Gibson is calling because she has been monitoring her BP and it don't seem to be coming down and would like to speak to someone about it. Please call    Thanks

## 2014-11-11 NOTE — Telephone Encounter (Signed)
Communicated Dr. Doug Sou instructions w/ patient. She voiced understanding. Requested that she continue to monitor BPs daily/BID, notify if any symptoms worse or new.

## 2014-11-11 NOTE — Telephone Encounter (Signed)
I would increase Valsartan to 320 mg daily.  Swan Zayed Martinique MD, [facility]

## 2014-11-11 NOTE — Addendum Note (Signed)
Addended by: Theodore Demark on: 11/11/2014 05:29 PM   Modules accepted: Medications

## 2014-11-11 NOTE — Telephone Encounter (Signed)
Spoke w/ patient, she is concerned over consistently elevated BPs. Since she is not being seen until May, she was hoping meds could be adjusted in interim.   She takes:  valsartan 160mg  DAILY AM  labetalol 200mg  BID  She notes improvement from historical values but gives sampling:   AM BP's 121/79, 131/90, 138/92, 149/79  PM BP's 144/88, 169/99, 147/89  Routing to Dr. Martinique to advise.

## 2014-11-23 ENCOUNTER — Telehealth: Payer: Self-pay | Admitting: Cardiology

## 2014-11-23 MED ORDER — LABETALOL HCL 200 MG PO TABS: 200.0000 mg | ORAL_TABLET | Freq: Two times a day (BID) | ORAL | Status: DC

## 2014-11-23 NOTE — Telephone Encounter (Signed)
Rx(s) sent to pharmacy electronically.  

## 2014-11-23 NOTE — Telephone Encounter (Signed)
°  1. Which medications need to be refilled? Labetalol  2. Which pharmacy is medication to be sent to?[facility] on [address]  3. Do they need a 30 day or 90 day supply? 30  4. Would they like a call back once the medication has been sent to the pharmacy? no

## 2014-11-24 ENCOUNTER — Telehealth: Payer: Self-pay | Admitting: Cardiology

## 2014-11-24 ENCOUNTER — Other Ambulatory Visit: Payer: Self-pay | Admitting: Cardiology

## 2014-11-24 MED ORDER — VALSARTAN 160 MG PO TABS: 320.0000 mg | ORAL_TABLET | Freq: Every day | ORAL | Status: DC

## 2014-11-24 NOTE — Telephone Encounter (Signed)
Pt called in to let us know that the pharmacy needs a prior authorization in order to refill the medication for 90 days. Please call  Thanks

## 2014-11-24 NOTE — Telephone Encounter (Signed)
Spoke w/ patient, PA is needed for valsartan.  [place], [place] submitted via telephone. Will await fax determination.

## 2014-11-24 NOTE — Telephone Encounter (Signed)
Pt called in stating that she needs a new prescription for her Valsartan because Dr. Martinique changed her dosage to bid instead of once a day. Please f/u  Thanks

## 2014-11-24 NOTE — Telephone Encounter (Signed)
Rx was increased on 11/11/14 per Dr.Jordan in a telephone encounter. Rx was sent into patient pharmacy. Patient was called and notified

## 2014-11-25 ENCOUNTER — Other Ambulatory Visit: Payer: Self-pay | Admitting: *Deleted

## 2014-11-25 MED ORDER — VALSARTAN 320 MG PO TABS: 320.0000 mg | ORAL_TABLET | Freq: Every day | ORAL | Status: DC

## 2014-11-25 NOTE — Telephone Encounter (Signed)
Spoke with pt, her insurance wants her to take the valsartan 320 mg once daily instead of the 160 bid. New script sent to the pharmacy. She will call if she has problems after the change

## 2014-12-04 ENCOUNTER — Ambulatory Visit: Payer: Managed Care, Other (non HMO) | Admitting: Cardiology

## 2015-01-11 ENCOUNTER — Ambulatory Visit (INDEPENDENT_AMBULATORY_CARE_PROVIDER_SITE_OTHER): Payer: Managed Care, Other (non HMO) | Admitting: Cardiology

## 2015-01-11 ENCOUNTER — Encounter: Payer: Self-pay | Admitting: Cardiology

## 2015-01-11 VITALS — BP 130/84 | HR 56 | Ht <= 58 in | Wt 128.1 lb

## 2015-01-11 DIAGNOSIS — I10 Essential (primary) hypertension: Secondary | ICD-10-CM

## 2015-01-11 DIAGNOSIS — I456 Pre-excitation syndrome: Secondary | ICD-10-CM

## 2015-01-11 LAB — BASIC METABOLIC PANEL
BUN: 12 mg/dL (ref 6–23)
CALCIUM: 9.9 mg/dL (ref 8.4–10.5)
CO2: 27 mEq/L (ref 19–32)
CREATININE: 0.67 mg/dL (ref 0.50–1.10)
Chloride: 102 mEq/L (ref 96–112)
Glucose, Bld: 80 mg/dL (ref 70–99)
Potassium: 4.2 mEq/L (ref 3.5–5.3)
Sodium: 138 mEq/L (ref 135–145)

## 2015-01-11 NOTE — Progress Notes (Signed)
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 She has never had palpitations or tachycardia. No history of syncope or dizziness. No history of murmur.  She had been on labetalol for HTN but was still uncontrolled. Diovan was added. Metanephrines were normal. Renal doppler was normal.  She denies any symptoms. No chest pain or dyspnea. Tolerating her medications well.    Medication List       This list is accurate as of: 01/11/15  5:25 PM.  Always use your most recent med list.               labetalol 200 MG tablet  Commonly known as:  NORMODYNE  Take 1 tablet (200 mg total) by mouth 2 (two) times daily.     valsartan 320 MG tablet  Commonly known as:  DIOVAN  Take 1 tablet (320 mg total) by mouth daily.        No Known Allergies  Past Medical History  Diagnosis Date  . Diabetes mellitus     gestational with preg  . WPW syndrome   . HTN (hypertension)     Past Surgical History  Procedure Laterality Date  . Cesarean section    . Hernia repair    . Cesarean section  08/09/2012    Procedure: CESAREAN SECTION;  Surgeon: Margarette Asal, MD;  Location: [facility];  Service: Obstetrics;  Laterality: N/A;  REPEAT EDC 08/16/12    History   Social History  . Marital Status: Married    Spouse Name: N/A  . Number of Children: 2  . Years of Education: N/A   Occupational History  . speech therapist    Social History Main Topics  . Smoking status: Former Smoker -- 0.15 packs/day for 4 years    Types: Cigarettes    Quit date: 06/13/2004  . Smokeless tobacco: Never Used  . Alcohol Use: No  . Drug Use: No  . Sexual Activity: Yes    Birth Control/ Protection: None     Comment: pregnant   Other Topics Concern  . None   Social History Narrative    Family History  Problem Relation Age of Onset  .  Hyperlipidemia Other   . Hypertension Other     Review of Systems: As noted in HPI.  All other systems were reviewed and are negative.  Physical Exam: BP 130/84 mmHg  Pulse 56  Ht 4\' 10"  (1.473 m)  Wt 128 lb 2 oz (58.117 kg)  BMI 26.79 kg/m2 Filed Weights   01/11/15 1619  Weight: 128 lb 2 oz (58.117 kg)  GENERAL:  Well appearing WF in NAD HEENT:  PERRL, EOMI, sclera are clear. Oropharynx is clear. NECK:  No jugular venous distention, carotid upstroke brisk and symmetric, no bruits, no thyromegaly or adenopathy LUNGS:  Clear to auscultation bilaterally CHEST:  Unremarkable HEART:  RRR,  PMI not displaced or sustained,S1 and S2 within normal limits, no S3, no S4: no clicks, no rubs, no murmurs ABD:  Soft, nontender. BS +, no masses or bruits. No hepatomegaly, no splenomegaly EXT:  2 + pulses throughout, no edema, no cyanosis no clubbing SKIN:  Warm and dry.  No rashes NEURO:  Alert and oriented x 3. Cranial nerves II through XII intact. PSYCH:  Cognitively intact    LABORATORY DATA:   Assessment /  Plan: 1. Severe HTN. BP improved and now well controlled on labetalol and diovan. Continue current therapy. Low sodium, heart healthy diet. Continue exercise. I will follow up as needed.  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 Patient is asymptomatic. At this point I have not recommended any further work up. If she should develop symptoms of palpitations in the future we would need to consider further work up.

## 2015-01-11 NOTE — Patient Instructions (Signed)
We will check a renal panel.  Continue your current therapy  I will see you as needed.

## 2015-06-03 ENCOUNTER — Other Ambulatory Visit: Payer: Self-pay | Admitting: *Deleted

## 2015-06-03 MED ORDER — LABETALOL HCL 200 MG PO TABS: 200.0000 mg | ORAL_TABLET | Freq: Two times a day (BID) | ORAL | Status: DC

## 2015-11-29 ENCOUNTER — Other Ambulatory Visit: Payer: Self-pay | Admitting: *Deleted

## 2015-11-29 MED ORDER — VALSARTAN 320 MG PO TABS: 320.0000 mg | ORAL_TABLET | Freq: Every day | ORAL | Status: DC

## 2016-02-04 ENCOUNTER — Other Ambulatory Visit: Payer: Self-pay | Admitting: Cardiology

## 2016-04-04 ENCOUNTER — Telehealth: Payer: Self-pay | Admitting: Cardiology

## 2016-04-04 NOTE — Telephone Encounter (Signed)
Follow up       *STAT* If patient is at the pharmacy, call can be transferred to refill team.   1. Which medications need to be refilled? (please list name of each medication and dose if known)  Labetalol 200 mg 2. Which pharmacy/location (including street and city if local pharmacy) is medication to be sent to? [facility] [address] 3. Do they need a 30 day or 90 day supply?  [place]

## 2016-04-04 NOTE — Telephone Encounter (Signed)
Left message to call back Last ov from Dr Martinique April 2016 says for patient to follow up as needed

## 2016-04-04 NOTE — Telephone Encounter (Signed)
Pt said she went to pick up her Labetalol and was told to contact this office. She said when she saw Dr Martinique last,she was told to call if she had any issues.Her primary doctor told her to contact Dr Martinique for refills or new prescriptions.

## 2016-04-06 MED ORDER — LABETALOL HCL 200 MG PO TABS: 200.0000 mg | ORAL_TABLET | Freq: Two times a day (BID) | ORAL | Status: DC

## 2016-04-06 NOTE — Telephone Encounter (Signed)
Left message Rx sent to pharmacy but further refills need to come from PCP since Dr Martinique only wanted PRN follow up

## 2016-06-01 ENCOUNTER — Other Ambulatory Visit: Payer: Self-pay

## 2016-06-01 MED ORDER — LABETALOL HCL 200 MG PO TABS: 200.0000 mg | ORAL_TABLET | Freq: Two times a day (BID) | ORAL | 0 refills | Status: DC

## 2016-08-29 ENCOUNTER — Other Ambulatory Visit: Payer: Self-pay | Admitting: Cardiology

## 2016-08-29 NOTE — Telephone Encounter (Signed)
Rx has been sent to the pharmacy electronically. ° °

## 2016-09-06 ENCOUNTER — Other Ambulatory Visit: Payer: Self-pay | Admitting: Cardiology

## 2016-09-13 ENCOUNTER — Other Ambulatory Visit: Payer: Self-pay | Admitting: Cardiology

## 2016-09-19 ENCOUNTER — Other Ambulatory Visit: Payer: Self-pay | Admitting: Cardiology

## 2016-09-19 NOTE — Telephone Encounter (Signed)
REFILL 

## 2016-12-04 DIAGNOSIS — Z0000 Encounter for general adult medical examination without abnormal findings: Secondary | ICD-10-CM | POA: Diagnosis not present

## 2016-12-04 DIAGNOSIS — I10 Essential (primary) hypertension: Secondary | ICD-10-CM | POA: Diagnosis not present

## 2016-12-11 DIAGNOSIS — I456 Pre-excitation syndrome: Secondary | ICD-10-CM | POA: Diagnosis not present

## 2016-12-11 DIAGNOSIS — I10 Essential (primary) hypertension: Secondary | ICD-10-CM | POA: Diagnosis not present

## 2016-12-11 DIAGNOSIS — Z0000 Encounter for general adult medical examination without abnormal findings: Secondary | ICD-10-CM | POA: Diagnosis not present

## 2016-12-12 ENCOUNTER — Other Ambulatory Visit: Payer: Self-pay | Admitting: Internal Medicine

## 2016-12-12 DIAGNOSIS — E079 Disorder of thyroid, unspecified: Secondary | ICD-10-CM

## 2016-12-21 ENCOUNTER — Ambulatory Visit
Admission: RE | Admit: 2016-12-21 | Discharge: 2016-12-21 | Disposition: A | Discharge status: Home / Self Care | Payer: 59 | Source: Ambulatory Visit | Attending: Internal Medicine | Admitting: Internal Medicine

## 2016-12-21 DIAGNOSIS — E042 Nontoxic multinodular goiter: Secondary | ICD-10-CM | POA: Diagnosis not present

## 2016-12-21 DIAGNOSIS — E079 Disorder of thyroid, unspecified: Secondary | ICD-10-CM

## 2017-01-03 ENCOUNTER — Other Ambulatory Visit: Payer: Self-pay | Admitting: Internal Medicine

## 2017-01-03 DIAGNOSIS — E041 Nontoxic single thyroid nodule: Secondary | ICD-10-CM

## 2017-01-04 ENCOUNTER — Ambulatory Visit
Admission: RE | Admit: 2017-01-04 | Discharge: 2017-01-04 | Disposition: A | Discharge status: Home / Self Care | Payer: 59 | Source: Ambulatory Visit | Attending: Internal Medicine | Admitting: Internal Medicine

## 2017-01-04 ENCOUNTER — Other Ambulatory Visit (HOSPITAL_COMMUNITY)
Admission: RE | Admit: 2017-01-04 | Discharge: 2017-01-04 | Disposition: A | Discharge status: Home / Self Care | Payer: 59 | Source: Ambulatory Visit | Attending: Physician Assistant | Admitting: Physician Assistant

## 2017-01-04 DIAGNOSIS — E041 Nontoxic single thyroid nodule: Secondary | ICD-10-CM

## 2017-01-04 NOTE — Procedures (Signed)
PROCEDURE SUMMARY:  Using direct ultrasound guidance, 4 passes were made using 25 g needles into the nodule within the left lobe of the thyroid.   Ultrasound was used to confirm needle placements on all occasions.   Specimens were sent to Pathology for analysis.  Makenli Derstine S Regine Christian PA-C 01/04/2017 10:41 AM

## 2017-01-25 DIAGNOSIS — E079 Disorder of thyroid, unspecified: Secondary | ICD-10-CM | POA: Diagnosis not present

## 2017-04-28 ENCOUNTER — Encounter (HOSPITAL_COMMUNITY): Payer: Self-pay | Admitting: Family Medicine

## 2017-04-28 ENCOUNTER — Ambulatory Visit (HOSPITAL_COMMUNITY)
Admission: EM | Admit: 2017-04-28 | Discharge: 2017-04-28 | Disposition: A | Discharge status: Home / Self Care | Payer: 59 | Attending: Radiology | Admitting: Radiology

## 2017-04-28 DIAGNOSIS — R109 Unspecified abdominal pain: Secondary | ICD-10-CM | POA: Diagnosis not present

## 2017-04-28 DIAGNOSIS — K2900 Acute gastritis without bleeding: Secondary | ICD-10-CM | POA: Diagnosis not present

## 2017-04-28 DIAGNOSIS — R1013 Epigastric pain: Secondary | ICD-10-CM | POA: Diagnosis not present

## 2017-04-28 LAB — POCT H PYLORI SCREEN: H. PYLORI SCREEN, POC: NEGATIVE

## 2017-04-28 MED ORDER — GI COCKTAIL ~~LOC~~
30.0000 mL | Freq: Once | ORAL | Status: AC
  Administered 2017-04-28: 30 mL via ORAL

## 2017-04-28 MED ORDER — GI COCKTAIL ~~LOC~~
ORAL | Status: AC
  Filled 2017-04-28: qty 30

## 2017-04-28 MED ORDER — SUCRALFATE 1 G PO TABS: 1.0000 g | ORAL_TABLET | Freq: Three times a day (TID) | ORAL | 0 refills | Status: AC

## 2017-04-28 MED ORDER — OMEPRAZOLE 20 MG PO CPDR: 20.0000 mg | DELAYED_RELEASE_CAPSULE | Freq: Two times a day (BID) | ORAL | 0 refills | Status: AC

## 2017-04-28 NOTE — ED Triage Notes (Signed)
Pt here for epigastric pain with burning. sts x 5 days and she has been taking OTC meds without relief.

## 2017-04-28 NOTE — ED Provider Notes (Addendum)
CSN: [id number]     Arrival date & time 04/28/17  1201 History   None    Chief Complaint  Patient presents with  . Abdominal Pain   (Consider location/radiation/quality/duration/timing/severity/associated sxs/prior Treatment) 36 y.o. female presents with persistent upper gastric pain described as burning X 5 day.  Condition is acute in nature. Condition is made better by nothing. Condition is made worse by nothing. Patient denies any relief from prescription pepcid, peptobismol and gas x prior to there arrival at this facility. Patient denies any nause, vomitting diarrhea or sore throat.       Past Medical History:  Diagnosis Date  . Diabetes mellitus    gestational with preg  . HTN (hypertension)   . WPW syndrome    Past Surgical History:  Procedure Laterality Date  . CESAREAN SECTION    . CESAREAN SECTION  08/09/2012   Procedure: CESAREAN SECTION;  Surgeon: Margarette Asal, MD;  Location: [facility];  Service: Obstetrics;  Laterality: N/A;  REPEAT EDC 08/16/12  . HERNIA REPAIR     Family History  Problem Relation Age of Onset  . Hyperlipidemia Other   . Hypertension Other    Social History  Substance Use Topics  . Smoking status: Former Smoker    Packs/day: 0.15    Years: 4.00    Types: Cigarettes    Quit date: 06/13/2004  . Smokeless tobacco: Never Used  . Alcohol use No   OB History    Gravida Para Term Preterm AB Living   3 2 2   1 2    SAB TAB Ectopic Multiple Live Births   1       2     Review of Systems  Constitutional: Negative for chills and fever.  HENT: Negative for ear pain and sore throat.   Eyes: Negative for pain and visual disturbance.  Respiratory: Negative for cough and shortness of breath.   Cardiovascular: Negative for chest pain and palpitations.  Gastrointestinal: Negative for abdominal pain and vomiting.  Genitourinary: Negative for dysuria and hematuria.       Upper epigastric burning  Musculoskeletal: Negative for arthralgias and back  pain.  Skin: Negative for color change and rash.  Neurological: Negative for seizures and syncope.  All other systems reviewed and are negative.   Allergies  Patient has no known allergies.  Home Medications   Prior to Admission medications   Medication Sig Start Date End Date Taking? Authorizing Provider  labetalol (NORMODYNE) 200 MG tablet TAKE 1 TABLET TWICE A DAY 09/19/16   Martinique, Peter M, MD  omeprazole (PRILOSEC) 20 MG capsule Take 1 capsule (20 mg total) by mouth 2 (two) times daily before a meal. 04/28/17   Omohundro, Gerline Legacy, NP  sucralfate (CARAFATE) 1 g tablet Take 1 tablet (1 g total) by mouth 4 (four) times daily -  with meals and at bedtime. 04/28/17 05/12/17  Jacqualine Mau, NP  valsartan (DIOVAN) 320 MG tablet TAKE 1 TABLET (320 MG TOTAL) BY MOUTH DAILY. NEEDS APPT. 09/13/16   Martinique, Peter M, MD   Meds Ordered and Administered this Visit   Medications  gi cocktail (Maalox,Lidocaine,Donnatal) (not administered)    BP (!) 132/91   Pulse 80   Temp 98.2 F (36.8 C)   Resp 18   SpO2 100%  No data found.   Physical Exam  Constitutional: She is oriented to person, place, and time. She appears well-developed and well-nourished.  HENT:  Head: Normocephalic and atraumatic.  Eyes: Conjunctivae are  normal.  Neck: Normal range of motion.  Cardiovascular: Normal rate and regular rhythm.   Pulmonary/Chest: Effort normal and breath sounds normal.  Neurological: She is alert and oriented to person, place, and time.  Psychiatric: She has a normal mood and affect.  Nursing note and vitals reviewed.   Urgent Care Course     Procedures (including critical care time)  Labs Review Labs Reviewed  POCT H PYLORI SCREEN    Imaging Review No results found.           MDM   1. Acute superficial gastritis without hemorrhage        Jacqualine Mau, NP 04/28/17 1305    Jacqualine Mau, NP 04/28/17 1306

## 2017-06-20 DIAGNOSIS — D485 Neoplasm of uncertain behavior of skin: Secondary | ICD-10-CM | POA: Diagnosis not present

## 2017-06-20 DIAGNOSIS — L821 Other seborrheic keratosis: Secondary | ICD-10-CM | POA: Diagnosis not present

## 2017-06-20 DIAGNOSIS — D1801 Hemangioma of skin and subcutaneous tissue: Secondary | ICD-10-CM | POA: Diagnosis not present

## 2017-06-20 DIAGNOSIS — D2361 Other benign neoplasm of skin of right upper limb, including shoulder: Secondary | ICD-10-CM | POA: Diagnosis not present

## 2017-08-22 DIAGNOSIS — Z01419 Encounter for gynecological examination (general) (routine) without abnormal findings: Secondary | ICD-10-CM | POA: Diagnosis not present

## 2017-09-19 DIAGNOSIS — I456 Pre-excitation syndrome: Secondary | ICD-10-CM | POA: Diagnosis not present

## 2017-09-19 DIAGNOSIS — R000 Tachycardia, unspecified: Secondary | ICD-10-CM | POA: Diagnosis not present

## 2017-10-08 ENCOUNTER — Encounter: Payer: Self-pay | Admitting: Cardiology

## 2017-10-08 ENCOUNTER — Ambulatory Visit: Payer: 59 | Admitting: Cardiology

## 2017-10-08 VITALS — BP 118/74 | HR 74 | Ht 59.0 in | Wt 130.4 lb

## 2017-10-08 DIAGNOSIS — I10 Essential (primary) hypertension: Secondary | ICD-10-CM

## 2017-10-08 DIAGNOSIS — I456 Pre-excitation syndrome: Secondary | ICD-10-CM

## 2017-10-08 DIAGNOSIS — R42 Dizziness and giddiness: Secondary | ICD-10-CM | POA: Diagnosis not present

## 2017-10-08 NOTE — Progress Notes (Signed)
Nichole Gibson Date of Birth: [birth year] Medical Record #[id number]  History of Present Illness: Nichole Gibson is seen for follow up of HTN and WPW syndrome.  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 She has never had palpitations or tachycardia. No history of syncope or dizziness. No history of murmur.  She had been on labetalol for HTN but was still uncontrolled. Diovan was added. Metanephrines were normal. Renal doppler was normal.   On follow up today she reports a couple of episodes of palpitations associated with dizziness. She was getting in her car when she felt heart beating really fast and felt lightheaded. This lasted a couple of minutes. Later at [place] she was bending over and stood up when symptoms recurred. She "saw stars" and thought she may pass out. Again HR elevated. By her Apple watch HR increased to 147 each time. Seen by Dr. Shelia Media and Ecg unchagned.  She denies any other symptoms. No chest pain or dyspnea. Tolerating her medications well.  Allergies as of 10/08/2017   No Known Allergies     Medication List        Accurate as of 10/08/17  1:50 PM. Always use your most recent med list.          labetalol 200 MG tablet Commonly known as:  NORMODYNE TAKE 1 TABLET TWICE A DAY   omeprazole 20 MG capsule Commonly known as:  PRILOSEC Take 1 capsule (20 mg total) by mouth 2 (two) times daily before a meal.   sucralfate 1 g tablet Commonly known as:  CARAFATE Take 1 tablet (1 g total) by mouth 4 (four) times daily -  with meals and at bedtime.   valsartan 320 MG tablet Commonly known as:  DIOVAN TAKE 1 TABLET (320 MG TOTAL) BY MOUTH DAILY. NEEDS APPT.       No Known Allergies  Past Medical History:  Diagnosis Date  . Diabetes mellitus    gestational with preg  . HTN (hypertension)   . WPW syndrome     Past Surgical History:  Procedure Laterality Date  . CESAREAN SECTION    . CESAREAN SECTION  08/09/2012   Procedure: CESAREAN SECTION;  Surgeon:  Margarette Asal, MD;  Location: [facility];  Service: Obstetrics;  Laterality: N/A;  REPEAT EDC 08/16/12  . HERNIA REPAIR      Social History   Socioeconomic History  . Marital status: Married    Spouse name: None  . Number of children: 2  . Years of education: None  . Highest education level: None  Social Needs  . Financial resource strain: None  . Food insecurity - worry: None  . Food insecurity - inability: None  . Transportation needs - medical: None  . Transportation needs - non-medical: None  Occupational History  . Occupation: speech therapist  Tobacco Use  . Smoking status: Former Smoker    Packs/day: 0.15    Years: 4.00    Pack years: 0.60    Types: Cigarettes    Last attempt to quit: 06/13/2004    Years since quitting: 13.3  . Smokeless tobacco: Never Used  Substance and Sexual Activity  . Alcohol use: No  . Drug use: No  . Sexual activity: Yes    Birth control/protection: None    Comment: pregnant  Other Topics Concern  . None  Social History Narrative  . None    Family History  Problem Relation Age of Onset  . Hyperlipidemia Other   .  Hypertension Other     Review of Systems: As noted in HPI.  All other systems were reviewed and are negative.  Physical Exam: BP 118/74   Pulse 74   Ht 4\' 11"  (1.499 m)   Wt 130 lb 6.4 oz (59.1 kg)   BMI 26.34 kg/m  Filed Weights   10/08/17 1333  Weight: 130 lb 6.4 oz (59.1 kg)  GENERAL:  Well appearing, WF in NAD HEENT:  PERRL, EOMI, sclera are clear. Oropharynx is clear. NECK:  No jugular venous distention, carotid upstroke brisk and symmetric, no bruits, no thyromegaly or adenopathy LUNGS:  Clear to auscultation bilaterally CHEST:  Unremarkable HEART:  RRR,  PMI not displaced or sustained,S1 and S2 within normal limits, no S3, no S4: no clicks, no rubs, no murmurs ABD:  Soft, nontender. BS +, no masses or bruits. No hepatomegaly, no splenomegaly EXT:  2 + pulses throughout, no edema, no cyanosis no  clubbing SKIN:  Warm and dry.  No rashes NEURO:  Alert and oriented x 3. Cranial nerves II through XII intact. PSYCH:  Cognitively intact     LABORATORY DATA: Dated 12/04/16: cholesterol 198, triglycerides 172, LDL 111, HDL 53. CMET, CBC, TSH all normal.   Assessment / Plan: 1. Severe HTN. BP now well controlled on labetalol and diovan. Continue current therapy. Low sodium, heart healthy diet. Continue exercise.   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 Patient did have 2 recent episodes of tachycardia associated with lightheadedness. No true syncope. This is the first time ever she had palpitations. It is certainly plausible that her WPW is involved but really no documentation to go on.  At this point I have not recommended any further work up. If she should develop more frequent or severe symptoms of palpitations in the future I would recommend an event monitor. For now will just reduce caffeine intake.

## 2017-10-08 NOTE — Patient Instructions (Signed)
Watch for now. Let me know if symptoms increase in frequency or severity.  I will see you as needed.

## 2017-10-23 ENCOUNTER — Ambulatory Visit: Payer: 59 | Admitting: Physician Assistant

## 2017-12-12 DIAGNOSIS — Z0000 Encounter for general adult medical examination without abnormal findings: Secondary | ICD-10-CM | POA: Diagnosis not present

## 2017-12-12 DIAGNOSIS — I10 Essential (primary) hypertension: Secondary | ICD-10-CM | POA: Diagnosis not present

## 2017-12-12 DIAGNOSIS — E079 Disorder of thyroid, unspecified: Secondary | ICD-10-CM | POA: Diagnosis not present

## 2017-12-17 DIAGNOSIS — Z0000 Encounter for general adult medical examination without abnormal findings: Secondary | ICD-10-CM | POA: Diagnosis not present

## 2017-12-17 DIAGNOSIS — I10 Essential (primary) hypertension: Secondary | ICD-10-CM | POA: Diagnosis not present

## 2017-12-17 DIAGNOSIS — I456 Pre-excitation syndrome: Secondary | ICD-10-CM | POA: Diagnosis not present

## 2018-01-09 ENCOUNTER — Other Ambulatory Visit: Payer: Self-pay | Admitting: Endocrinology

## 2018-01-09 DIAGNOSIS — E079 Disorder of thyroid, unspecified: Secondary | ICD-10-CM

## 2018-01-15 DIAGNOSIS — E079 Disorder of thyroid, unspecified: Secondary | ICD-10-CM | POA: Diagnosis not present

## 2018-01-21 ENCOUNTER — Ambulatory Visit
Admission: RE | Admit: 2018-01-21 | Discharge: 2018-01-21 | Disposition: A | Discharge status: Home / Self Care | Payer: 59 | Source: Ambulatory Visit | Attending: Endocrinology | Admitting: Endocrinology

## 2018-01-21 DIAGNOSIS — E042 Nontoxic multinodular goiter: Secondary | ICD-10-CM | POA: Diagnosis not present

## 2018-01-21 DIAGNOSIS — E079 Disorder of thyroid, unspecified: Secondary | ICD-10-CM

## 2018-01-25 DIAGNOSIS — E042 Nontoxic multinodular goiter: Secondary | ICD-10-CM | POA: Diagnosis not present

## 2018-04-17 DIAGNOSIS — Z30430 Encounter for insertion of intrauterine contraceptive device: Secondary | ICD-10-CM | POA: Diagnosis not present

## 2018-06-25 DIAGNOSIS — D485 Neoplasm of uncertain behavior of skin: Secondary | ICD-10-CM | POA: Diagnosis not present

## 2018-06-25 DIAGNOSIS — Z23 Encounter for immunization: Secondary | ICD-10-CM | POA: Diagnosis not present

## 2018-06-25 DIAGNOSIS — C44619 Basal cell carcinoma of skin of left upper limb, including shoulder: Secondary | ICD-10-CM | POA: Diagnosis not present

## 2018-06-25 DIAGNOSIS — L821 Other seborrheic keratosis: Secondary | ICD-10-CM | POA: Diagnosis not present

## 2018-07-25 DIAGNOSIS — C44519 Basal cell carcinoma of skin of other part of trunk: Secondary | ICD-10-CM | POA: Diagnosis not present

## 2020-02-02 IMAGING — US US THYROID
1 series · 13 of 25 positions shown · non-contrast
Comparison: 12/21/2016

CLINICAL DATA: Asymmetrical thyroid. Nodules. Previous FNA biopsy
of left mid nodule 01/04/2017.

EXAM:
THYROID ULTRASOUND
TECHNIQUE: Ultrasound examination of the thyroid gland and adjacent soft
tissues was performed.

[Series 1: us thyroid · 0.05mm/px · 13 of 53 slices shown]
[im 1/53]
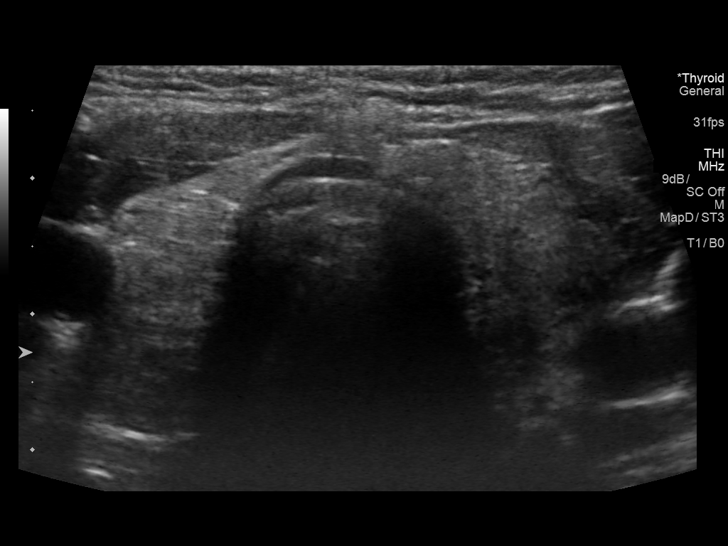
[im 5/53]
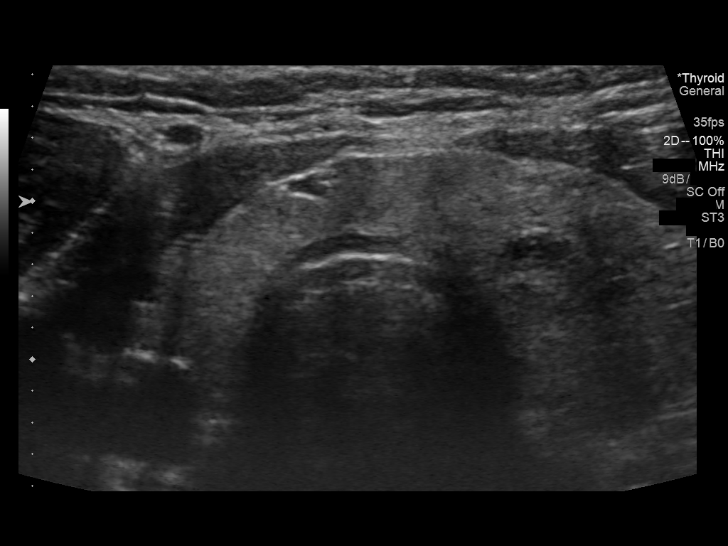
[im 9/53]
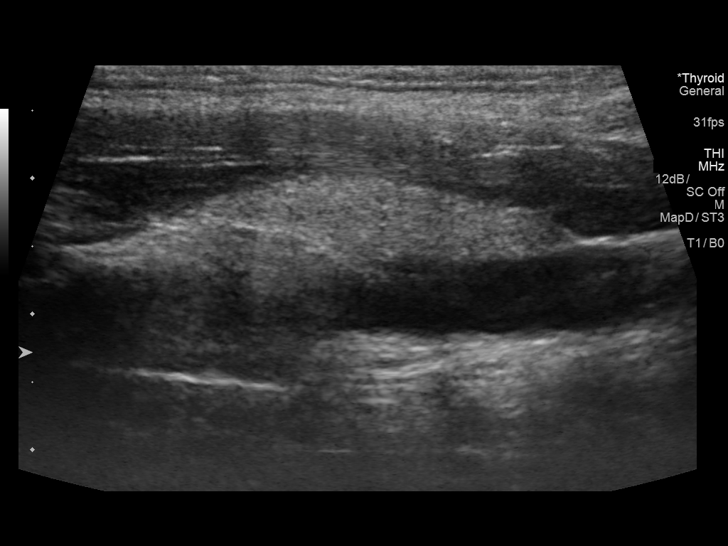
[im 14/53]
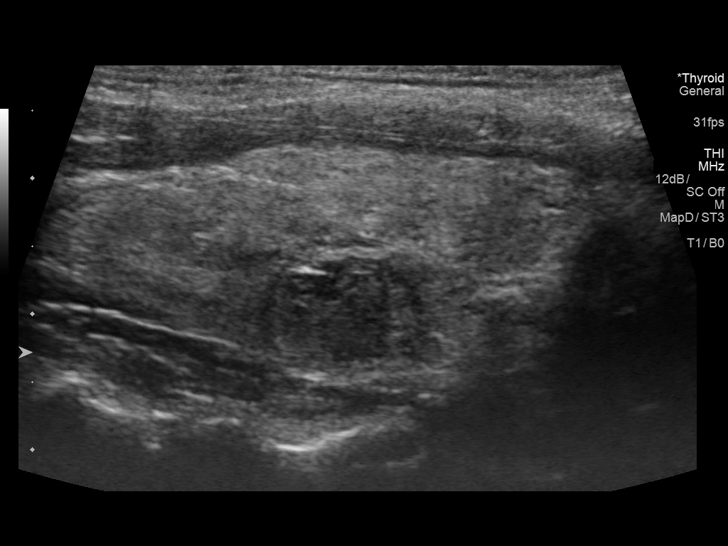
[im 18/53]
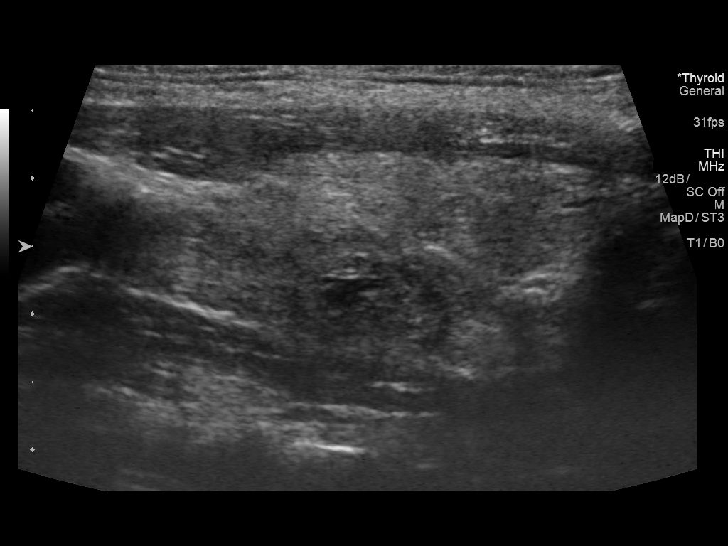
[im 22/53]
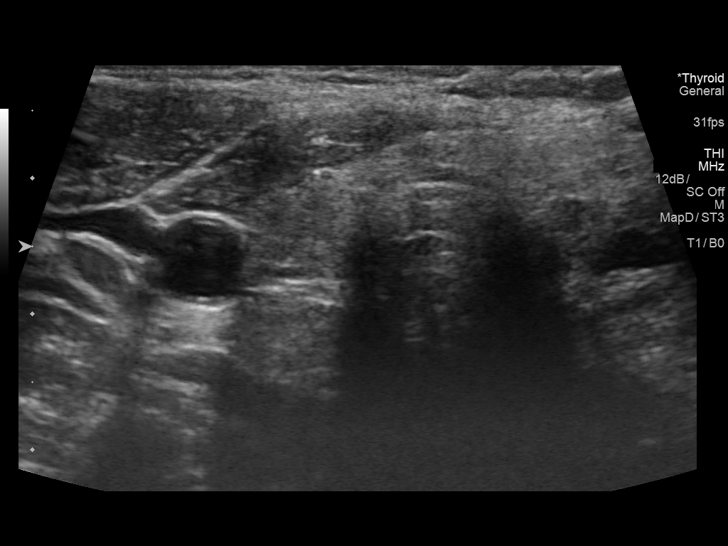
[im 27/53]
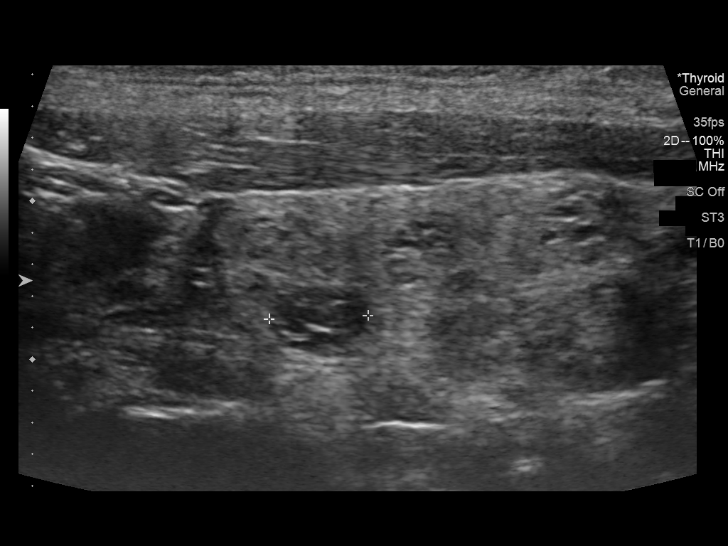
[im 31/53]
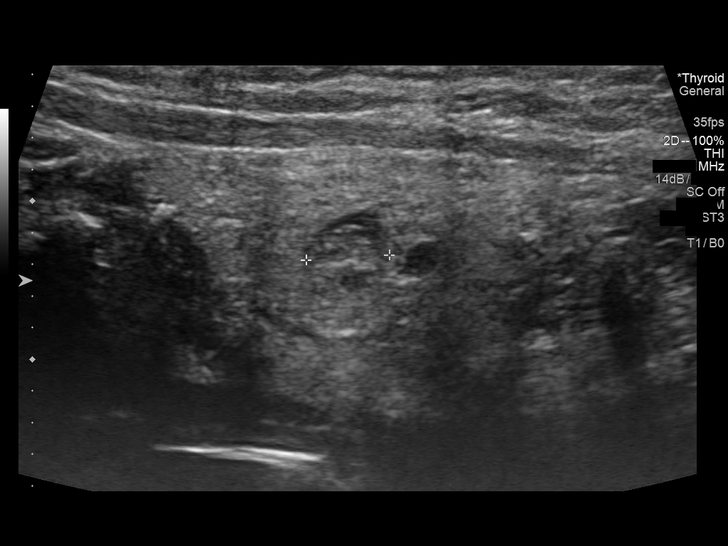
[im 35/53]
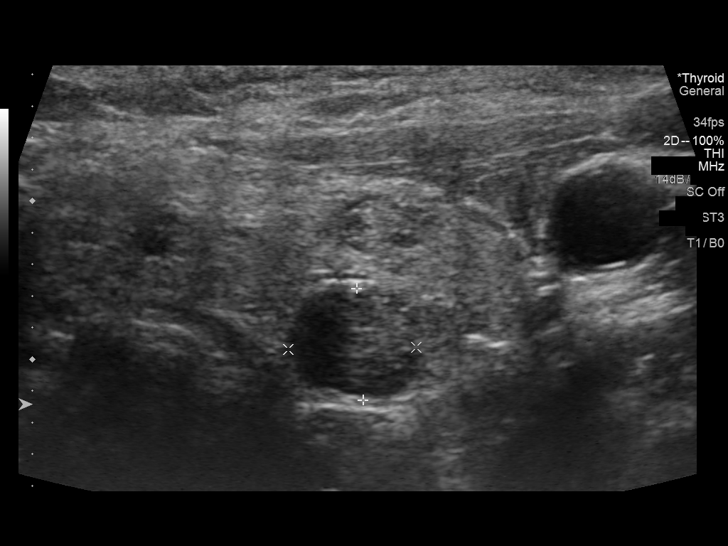
[im 40/53]
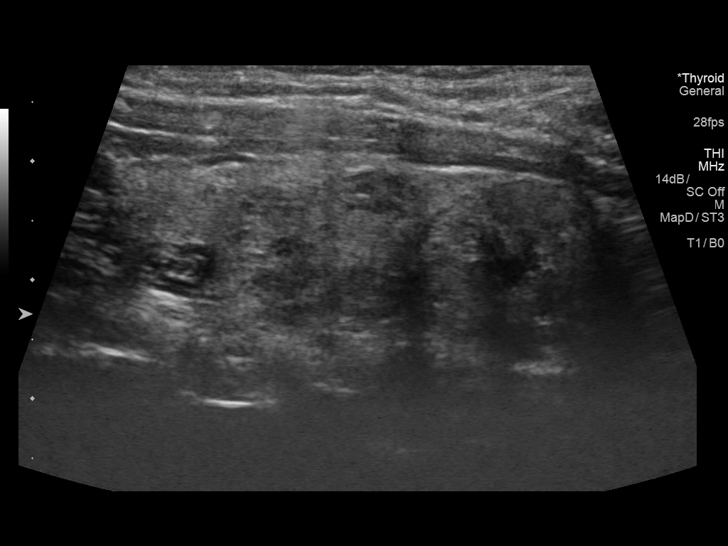
[im 44/53]
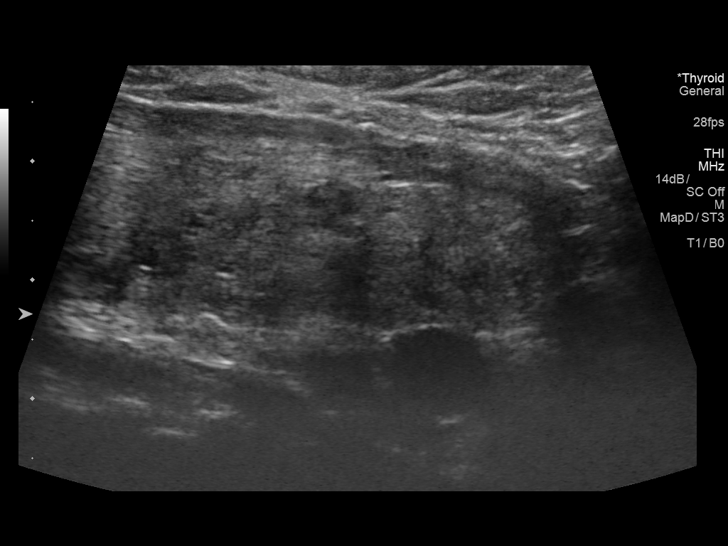
[im 48/53]
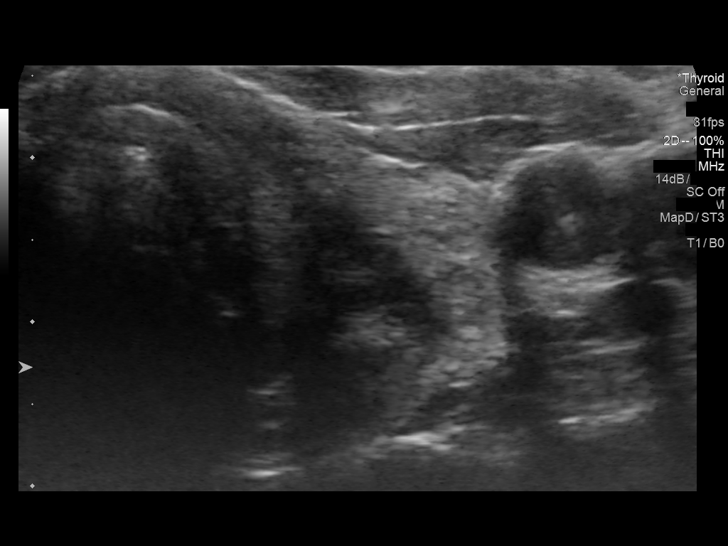
[im 53/53]
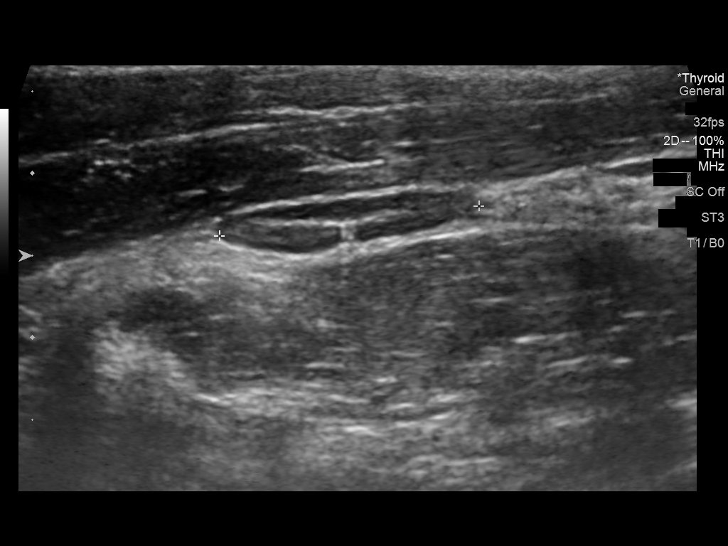

[13 of 25 positions shown; findings below may reference images not displayed]

FINDINGS: Parenchymal Echotexture: Moderately heterogenous

Isthmus: 0.5 cm thickness, previously

Right lobe: 4.5 x 1.7 x 1.3 cm, previously 3.9 x 1.4 x

Left lobe: 4.6 x 1.7 x 1.6 cm, previously 5.7 x 1.5 x

_________________________________________________________

Estimated total number of nodules >/= 1 cm: 2

Number of spongiform nodules >/=  2 cm not described below (TR1): 0

Number of mixed cystic and solid nodules >/= 1.5 cm not described
below (TR2): 0

_________________________________________________________

Nodule # 1:

Prior biopsy: No

Location: Right; Mid

Maximum size: 1.3 cm; Other 2 dimensions: 0.8 x 1.1 cm, previously,
1.1 x 0.8 x 0.8 cm

Composition: solid/almost completely solid (2)

Echogenicity: hypoechoic (2)

Shape: not taller-than-wide (0)

Margins: ill-defined (0)

Echogenic foci: none (0)

ACR TI-RADS total points: 4.

ACR TI-RADS risk category:  TR4 (4-6 points).

Significant change in size (>/= 20% in two dimensions and minimal
increase of 2 mm): No

Change in features: No

Change in ACR TI-RADS risk category: No

ACR TI-RADS recommendations:

*Given size (>/= 1 - 1.4 cm) and appearance, a follow-up ultrasound
in 1 year should be considered based on TI-RADS criteria.

_________________________________________________________

1.7 x 1.6 x 1.9 cm hypoechoic inferior left nodule, previously 1.4 x
1.1 x 1; this was previously biopsied.

At least 4 subcentimeter hypoechoic/cystic nodules without
calcificationson the left as previously noted.
IMPRESSION: 1. Normal-sized thyroid with bilateral nodules. None meets criteria
for biopsy.
2. Recommend 1-2 year follow-up surveillance ultrasound of right
nodule as above, until stability x5 years confirmed..

The above is in keeping with the ACR TI-RADS recommendations - [HOSPITAL] 3721;[DATE].

## 2020-03-11 ENCOUNTER — Other Ambulatory Visit: Payer: Self-pay | Admitting: Endocrinology

## 2020-03-11 DIAGNOSIS — E049 Nontoxic goiter, unspecified: Secondary | ICD-10-CM

## 2020-12-29 ENCOUNTER — Other Ambulatory Visit: Payer: Self-pay | Admitting: Endocrinology

## 2020-12-29 ENCOUNTER — Ambulatory Visit
Admission: RE | Admit: 2020-12-29 | Discharge: 2020-12-29 | Disposition: A | Discharge status: Home / Self Care | Payer: 59 | Source: Ambulatory Visit | Attending: Endocrinology | Admitting: Endocrinology

## 2020-12-29 DIAGNOSIS — E042 Nontoxic multinodular goiter: Secondary | ICD-10-CM

## 2021-09-09 ENCOUNTER — Other Ambulatory Visit: Payer: Self-pay | Admitting: Obstetrics and Gynecology

## 2021-09-09 DIAGNOSIS — N644 Mastodynia: Secondary | ICD-10-CM

## 2021-10-12 ENCOUNTER — Ambulatory Visit
Admission: RE | Admit: 2021-10-12 | Discharge: 2021-10-12 | Disposition: A | Discharge status: Home / Self Care | Payer: BC Managed Care – PPO | Source: Ambulatory Visit | Attending: Obstetrics and Gynecology | Admitting: Obstetrics and Gynecology

## 2021-10-12 DIAGNOSIS — N6001 Solitary cyst of right breast: Secondary | ICD-10-CM | POA: Diagnosis not present

## 2021-10-12 DIAGNOSIS — N644 Mastodynia: Secondary | ICD-10-CM

## 2021-10-12 DIAGNOSIS — R922 Inconclusive mammogram: Secondary | ICD-10-CM | POA: Diagnosis not present

## 2022-03-14 ENCOUNTER — Other Ambulatory Visit: Payer: Self-pay | Admitting: Endocrinology

## 2022-03-14 DIAGNOSIS — E042 Nontoxic multinodular goiter: Secondary | ICD-10-CM

## 2022-05-09 ENCOUNTER — Other Ambulatory Visit: Payer: Self-pay | Admitting: Endocrinology

## 2022-05-09 DIAGNOSIS — E042 Nontoxic multinodular goiter: Secondary | ICD-10-CM

## 2022-05-24 ENCOUNTER — Ambulatory Visit
Admission: RE | Admit: 2022-05-24 | Discharge: 2022-05-24 | Disposition: A | Discharge status: Home / Self Care | Payer: BC Managed Care – PPO | Source: Ambulatory Visit | Attending: Endocrinology | Admitting: Endocrinology

## 2022-05-24 DIAGNOSIS — E042 Nontoxic multinodular goiter: Secondary | ICD-10-CM

## 2022-05-31 DIAGNOSIS — Z0000 Encounter for general adult medical examination without abnormal findings: Secondary | ICD-10-CM | POA: Diagnosis not present

## 2022-05-31 DIAGNOSIS — E042 Nontoxic multinodular goiter: Secondary | ICD-10-CM | POA: Diagnosis not present

## 2022-05-31 DIAGNOSIS — I10 Essential (primary) hypertension: Secondary | ICD-10-CM | POA: Diagnosis not present

## 2022-06-07 ENCOUNTER — Other Ambulatory Visit: Payer: Self-pay | Admitting: Endocrinology

## 2022-06-07 DIAGNOSIS — E042 Nontoxic multinodular goiter: Secondary | ICD-10-CM

## 2022-06-08 DIAGNOSIS — Z0000 Encounter for general adult medical examination without abnormal findings: Secondary | ICD-10-CM | POA: Diagnosis not present

## 2022-06-08 DIAGNOSIS — I10 Essential (primary) hypertension: Secondary | ICD-10-CM | POA: Diagnosis not present

## 2022-06-08 DIAGNOSIS — E042 Nontoxic multinodular goiter: Secondary | ICD-10-CM | POA: Diagnosis not present

## 2022-07-04 ENCOUNTER — Ambulatory Visit
Admission: RE | Admit: 2022-07-04 | Discharge: 2022-07-04 | Disposition: A | Discharge status: Home / Self Care | Payer: BC Managed Care – PPO | Source: Ambulatory Visit | Attending: Endocrinology | Admitting: Endocrinology

## 2022-07-04 ENCOUNTER — Other Ambulatory Visit (HOSPITAL_COMMUNITY)
Admission: RE | Admit: 2022-07-04 | Discharge: 2022-07-04 | Disposition: A | Discharge status: Home / Self Care | Payer: BC Managed Care – PPO | Source: Ambulatory Visit | Attending: Student | Admitting: Student

## 2022-07-04 DIAGNOSIS — E041 Nontoxic single thyroid nodule: Secondary | ICD-10-CM | POA: Diagnosis not present

## 2022-07-04 DIAGNOSIS — E042 Nontoxic multinodular goiter: Secondary | ICD-10-CM

## 2022-07-06 LAB — CYTOLOGY - NON PAP

## 2022-07-20 DIAGNOSIS — Z6823 Body mass index (BMI) 23.0-23.9, adult: Secondary | ICD-10-CM | POA: Diagnosis not present

## 2022-07-20 DIAGNOSIS — Z01419 Encounter for gynecological examination (general) (routine) without abnormal findings: Secondary | ICD-10-CM | POA: Diagnosis not present

## 2022-07-20 DIAGNOSIS — Z124 Encounter for screening for malignant neoplasm of cervix: Secondary | ICD-10-CM | POA: Diagnosis not present

## 2022-07-20 DIAGNOSIS — Z1151 Encounter for screening for human papillomavirus (HPV): Secondary | ICD-10-CM | POA: Diagnosis not present

## 2022-12-26 ENCOUNTER — Other Ambulatory Visit: Payer: Self-pay | Admitting: Obstetrics and Gynecology

## 2022-12-26 DIAGNOSIS — Z1231 Encounter for screening mammogram for malignant neoplasm of breast: Secondary | ICD-10-CM

## 2023-01-02 DIAGNOSIS — D225 Melanocytic nevi of trunk: Secondary | ICD-10-CM | POA: Diagnosis not present

## 2023-01-02 DIAGNOSIS — Z85828 Personal history of other malignant neoplasm of skin: Secondary | ICD-10-CM | POA: Diagnosis not present

## 2023-01-02 DIAGNOSIS — L821 Other seborrheic keratosis: Secondary | ICD-10-CM | POA: Diagnosis not present

## 2023-01-02 DIAGNOSIS — L814 Other melanin hyperpigmentation: Secondary | ICD-10-CM | POA: Diagnosis not present

## 2023-01-30 ENCOUNTER — Ambulatory Visit
Admission: RE | Admit: 2023-01-30 | Discharge: 2023-01-30 | Disposition: A | Discharge status: Home / Self Care | Payer: BC Managed Care – PPO | Source: Ambulatory Visit | Attending: Obstetrics and Gynecology | Admitting: Obstetrics and Gynecology

## 2023-01-30 DIAGNOSIS — Z1231 Encounter for screening mammogram for malignant neoplasm of breast: Secondary | ICD-10-CM | POA: Diagnosis not present

## 2023-02-01 ENCOUNTER — Other Ambulatory Visit: Payer: Self-pay | Admitting: Obstetrics and Gynecology

## 2023-02-01 DIAGNOSIS — R928 Other abnormal and inconclusive findings on diagnostic imaging of breast: Secondary | ICD-10-CM

## 2023-02-13 ENCOUNTER — Ambulatory Visit
Admission: RE | Admit: 2023-02-13 | Discharge: 2023-02-13 | Disposition: A | Discharge status: Home / Self Care | Payer: BC Managed Care – PPO | Source: Ambulatory Visit | Attending: Obstetrics and Gynecology | Admitting: Obstetrics and Gynecology

## 2023-02-13 DIAGNOSIS — R921 Mammographic calcification found on diagnostic imaging of breast: Secondary | ICD-10-CM | POA: Diagnosis not present

## 2023-02-13 DIAGNOSIS — N6001 Solitary cyst of right breast: Secondary | ICD-10-CM | POA: Diagnosis not present

## 2023-02-13 DIAGNOSIS — R92341 Mammographic extreme density, right breast: Secondary | ICD-10-CM | POA: Diagnosis not present

## 2023-02-13 DIAGNOSIS — R928 Other abnormal and inconclusive findings on diagnostic imaging of breast: Secondary | ICD-10-CM

## 2023-05-31 DIAGNOSIS — Z0000 Encounter for general adult medical examination without abnormal findings: Secondary | ICD-10-CM | POA: Diagnosis not present

## 2023-05-31 DIAGNOSIS — E042 Nontoxic multinodular goiter: Secondary | ICD-10-CM | POA: Diagnosis not present

## 2023-05-31 DIAGNOSIS — R739 Hyperglycemia, unspecified: Secondary | ICD-10-CM | POA: Diagnosis not present

## 2023-05-31 DIAGNOSIS — I10 Essential (primary) hypertension: Secondary | ICD-10-CM | POA: Diagnosis not present

## 2023-06-05 DIAGNOSIS — Z0000 Encounter for general adult medical examination without abnormal findings: Secondary | ICD-10-CM | POA: Diagnosis not present

## 2023-06-05 DIAGNOSIS — Z8639 Personal history of other endocrine, nutritional and metabolic disease: Secondary | ICD-10-CM | POA: Diagnosis not present

## 2023-06-05 DIAGNOSIS — I10 Essential (primary) hypertension: Secondary | ICD-10-CM | POA: Diagnosis not present

## 2023-07-23 DIAGNOSIS — Z6824 Body mass index (BMI) 24.0-24.9, adult: Secondary | ICD-10-CM | POA: Diagnosis not present

## 2023-07-23 DIAGNOSIS — Z01419 Encounter for gynecological examination (general) (routine) without abnormal findings: Secondary | ICD-10-CM | POA: Diagnosis not present

## 2023-08-01 DIAGNOSIS — E042 Nontoxic multinodular goiter: Secondary | ICD-10-CM | POA: Diagnosis not present

## 2023-08-01 DIAGNOSIS — I10 Essential (primary) hypertension: Secondary | ICD-10-CM | POA: Diagnosis not present

## 2024-01-23 ENCOUNTER — Other Ambulatory Visit: Payer: Self-pay | Admitting: Obstetrics and Gynecology

## 2024-01-23 DIAGNOSIS — Z1231 Encounter for screening mammogram for malignant neoplasm of breast: Secondary | ICD-10-CM

## 2024-02-05 ENCOUNTER — Ambulatory Visit
Admission: RE | Admit: 2024-02-05 | Discharge: 2024-02-05 | Disposition: A | Discharge status: Home / Self Care | Source: Ambulatory Visit | Attending: Obstetrics and Gynecology | Admitting: Obstetrics and Gynecology

## 2024-02-05 DIAGNOSIS — Z1231 Encounter for screening mammogram for malignant neoplasm of breast: Secondary | ICD-10-CM

## 2024-07-03 DIAGNOSIS — I10 Essential (primary) hypertension: Secondary | ICD-10-CM | POA: Diagnosis not present

## 2024-07-03 DIAGNOSIS — Z0000 Encounter for general adult medical examination without abnormal findings: Secondary | ICD-10-CM | POA: Diagnosis not present

## 2024-07-03 DIAGNOSIS — R739 Hyperglycemia, unspecified: Secondary | ICD-10-CM | POA: Diagnosis not present

## 2024-07-10 ENCOUNTER — Other Ambulatory Visit (HOSPITAL_BASED_OUTPATIENT_CLINIC_OR_DEPARTMENT_OTHER): Payer: Self-pay | Admitting: Internal Medicine

## 2024-07-10 DIAGNOSIS — I456 Pre-excitation syndrome: Secondary | ICD-10-CM | POA: Diagnosis not present

## 2024-07-10 DIAGNOSIS — Z23 Encounter for immunization: Secondary | ICD-10-CM | POA: Diagnosis not present

## 2024-07-10 DIAGNOSIS — I10 Essential (primary) hypertension: Secondary | ICD-10-CM | POA: Diagnosis not present

## 2024-07-10 DIAGNOSIS — Z8632 Personal history of gestational diabetes: Secondary | ICD-10-CM | POA: Diagnosis not present

## 2024-07-10 DIAGNOSIS — E041 Nontoxic single thyroid nodule: Secondary | ICD-10-CM | POA: Diagnosis not present

## 2024-07-15 DIAGNOSIS — Z85828 Personal history of other malignant neoplasm of skin: Secondary | ICD-10-CM | POA: Diagnosis not present

## 2024-07-15 DIAGNOSIS — L814 Other melanin hyperpigmentation: Secondary | ICD-10-CM | POA: Diagnosis not present

## 2024-07-15 DIAGNOSIS — L821 Other seborrheic keratosis: Secondary | ICD-10-CM | POA: Diagnosis not present

## 2024-07-15 DIAGNOSIS — D485 Neoplasm of uncertain behavior of skin: Secondary | ICD-10-CM | POA: Diagnosis not present

## 2024-07-15 DIAGNOSIS — D225 Melanocytic nevi of trunk: Secondary | ICD-10-CM | POA: Diagnosis not present

## 2024-07-17 ENCOUNTER — Other Ambulatory Visit: Payer: Self-pay | Admitting: Endocrinology

## 2024-07-17 DIAGNOSIS — E042 Nontoxic multinodular goiter: Secondary | ICD-10-CM

## 2024-07-25 ENCOUNTER — Encounter: Payer: Self-pay | Admitting: *Deleted

## 2024-08-08 DIAGNOSIS — E041 Nontoxic single thyroid nodule: Secondary | ICD-10-CM | POA: Diagnosis not present

## 2024-08-11 DIAGNOSIS — C44519 Basal cell carcinoma of skin of other part of trunk: Secondary | ICD-10-CM | POA: Diagnosis not present

## 2024-08-20 ENCOUNTER — Other Ambulatory Visit: Payer: Self-pay | Admitting: *Deleted

## 2024-08-20 DIAGNOSIS — R2242 Localized swelling, mass and lump, left lower limb: Secondary | ICD-10-CM

## 2024-08-26 ENCOUNTER — Ambulatory Visit: Admitting: General Surgery
